# Patient Record
Sex: Female | Born: 1970 | Race: Black or African American | Hispanic: No | Marital: Married | Smoking: Never smoker
Health system: Southern US, Community
[De-identification: ages and names within clinical notes are randomized; demographics above are authoritative.]

## PROBLEM LIST (undated history)

## (undated) DIAGNOSIS — J45909 Unspecified asthma, uncomplicated: Secondary | ICD-10-CM

## (undated) DIAGNOSIS — E119 Type 2 diabetes mellitus without complications: Secondary | ICD-10-CM

---

## 1999-12-04 ENCOUNTER — Emergency Department (HOSPITAL_COMMUNITY): Admission: EM | Admit: 1999-12-04 | Discharge: 1999-12-04 | Payer: Self-pay | Admitting: Emergency Medicine

## 2000-04-02 ENCOUNTER — Emergency Department (HOSPITAL_COMMUNITY): Admission: EM | Admit: 2000-04-02 | Discharge: 2000-04-02 | Payer: Self-pay | Admitting: Emergency Medicine

## 2000-04-02 ENCOUNTER — Encounter: Payer: Self-pay | Admitting: Emergency Medicine

## 2000-05-04 ENCOUNTER — Emergency Department (HOSPITAL_COMMUNITY): Admission: EM | Admit: 2000-05-04 | Discharge: 2000-05-04 | Payer: Self-pay | Admitting: Emergency Medicine

## 2000-05-04 ENCOUNTER — Encounter: Payer: Self-pay | Admitting: Emergency Medicine

## 2000-07-29 ENCOUNTER — Emergency Department (HOSPITAL_COMMUNITY): Admission: EM | Admit: 2000-07-29 | Discharge: 2000-07-29 | Payer: Self-pay | Admitting: Emergency Medicine

## 2000-10-16 ENCOUNTER — Emergency Department (HOSPITAL_COMMUNITY): Admission: EM | Admit: 2000-10-16 | Discharge: 2000-10-17 | Payer: Self-pay | Admitting: Emergency Medicine

## 2000-10-24 ENCOUNTER — Emergency Department (HOSPITAL_COMMUNITY): Admission: EM | Admit: 2000-10-24 | Discharge: 2000-10-24 | Payer: Self-pay | Admitting: Emergency Medicine

## 2000-10-24 ENCOUNTER — Encounter: Payer: Self-pay | Admitting: Emergency Medicine

## 2001-02-12 ENCOUNTER — Emergency Department (HOSPITAL_COMMUNITY): Admission: EM | Admit: 2001-02-12 | Discharge: 2001-02-13 | Payer: Self-pay | Admitting: Emergency Medicine

## 2001-02-27 ENCOUNTER — Encounter: Payer: Self-pay | Admitting: Emergency Medicine

## 2001-02-27 ENCOUNTER — Emergency Department (HOSPITAL_COMMUNITY): Admission: EM | Admit: 2001-02-27 | Discharge: 2001-02-27 | Payer: Self-pay | Admitting: Emergency Medicine

## 2001-06-15 ENCOUNTER — Emergency Department (HOSPITAL_COMMUNITY): Admission: EM | Admit: 2001-06-15 | Discharge: 2001-06-15 | Payer: Self-pay | Admitting: Emergency Medicine

## 2001-06-15 ENCOUNTER — Encounter: Payer: Self-pay | Admitting: Emergency Medicine

## 2001-10-19 ENCOUNTER — Emergency Department (HOSPITAL_COMMUNITY): Admission: EM | Admit: 2001-10-19 | Discharge: 2001-10-19 | Payer: Self-pay | Admitting: Emergency Medicine

## 2002-01-11 ENCOUNTER — Emergency Department (HOSPITAL_COMMUNITY): Admission: EM | Admit: 2002-01-11 | Discharge: 2002-01-11 | Payer: Self-pay | Admitting: Emergency Medicine

## 2002-01-11 ENCOUNTER — Encounter: Payer: Self-pay | Admitting: Emergency Medicine

## 2002-11-02 ENCOUNTER — Encounter: Payer: Self-pay | Admitting: Emergency Medicine

## 2002-11-02 ENCOUNTER — Emergency Department (HOSPITAL_COMMUNITY): Admission: EM | Admit: 2002-11-02 | Discharge: 2002-11-02 | Payer: Self-pay | Admitting: Emergency Medicine

## 2003-03-17 ENCOUNTER — Emergency Department (HOSPITAL_COMMUNITY): Admission: EM | Admit: 2003-03-17 | Discharge: 2003-03-17 | Payer: Self-pay | Admitting: Emergency Medicine

## 2003-03-17 ENCOUNTER — Encounter: Payer: Self-pay | Admitting: Emergency Medicine

## 2003-05-04 ENCOUNTER — Emergency Department (HOSPITAL_COMMUNITY): Admission: EM | Admit: 2003-05-04 | Discharge: 2003-05-04 | Payer: Self-pay | Admitting: Emergency Medicine

## 2003-05-04 ENCOUNTER — Encounter: Payer: Self-pay | Admitting: Emergency Medicine

## 2003-05-17 ENCOUNTER — Emergency Department (HOSPITAL_COMMUNITY): Admission: EM | Admit: 2003-05-17 | Discharge: 2003-05-17 | Payer: Self-pay | Admitting: Emergency Medicine

## 2003-10-07 ENCOUNTER — Emergency Department (HOSPITAL_COMMUNITY): Admission: EM | Admit: 2003-10-07 | Discharge: 2003-10-07 | Payer: Self-pay | Admitting: Emergency Medicine

## 2004-04-03 ENCOUNTER — Emergency Department (HOSPITAL_COMMUNITY): Admission: EM | Admit: 2004-04-03 | Discharge: 2004-04-03 | Payer: Self-pay | Admitting: Emergency Medicine

## 2004-04-07 ENCOUNTER — Inpatient Hospital Stay (HOSPITAL_COMMUNITY): Admission: AD | Admit: 2004-04-07 | Discharge: 2004-04-07 | Payer: Self-pay | Admitting: Obstetrics and Gynecology

## 2004-12-05 ENCOUNTER — Emergency Department (HOSPITAL_COMMUNITY): Admission: EM | Admit: 2004-12-05 | Discharge: 2004-12-05 | Payer: Self-pay | Admitting: Emergency Medicine

## 2005-03-20 ENCOUNTER — Encounter: Payer: Self-pay | Admitting: Emergency Medicine

## 2005-03-20 ENCOUNTER — Ambulatory Visit (HOSPITAL_COMMUNITY): Admission: AD | Admit: 2005-03-20 | Discharge: 2005-03-20 | Payer: Self-pay | Admitting: Obstetrics and Gynecology

## 2005-03-20 ENCOUNTER — Encounter (INDEPENDENT_AMBULATORY_CARE_PROVIDER_SITE_OTHER): Payer: Self-pay | Admitting: Specialist

## 2005-10-21 ENCOUNTER — Emergency Department (HOSPITAL_COMMUNITY): Admission: EM | Admit: 2005-10-21 | Discharge: 2005-10-21 | Payer: Self-pay | Admitting: Emergency Medicine

## 2005-12-18 ENCOUNTER — Inpatient Hospital Stay (HOSPITAL_COMMUNITY): Admission: AD | Admit: 2005-12-18 | Discharge: 2005-12-18 | Payer: Self-pay | Admitting: Obstetrics and Gynecology

## 2006-09-26 ENCOUNTER — Emergency Department (HOSPITAL_COMMUNITY): Admission: EM | Admit: 2006-09-26 | Discharge: 2006-09-26 | Payer: Self-pay | Admitting: Emergency Medicine

## 2006-09-26 ENCOUNTER — Emergency Department (HOSPITAL_COMMUNITY): Admission: EM | Admit: 2006-09-26 | Discharge: 2006-09-26 | Payer: Self-pay | Admitting: Family Medicine

## 2008-03-29 ENCOUNTER — Emergency Department (HOSPITAL_COMMUNITY): Admission: EM | Admit: 2008-03-29 | Discharge: 2008-03-29 | Payer: Self-pay | Admitting: Family Medicine

## 2008-08-15 ENCOUNTER — Emergency Department (HOSPITAL_COMMUNITY): Admission: EM | Admit: 2008-08-15 | Discharge: 2008-08-15 | Payer: Self-pay | Admitting: Family Medicine

## 2008-08-15 ENCOUNTER — Ambulatory Visit: Payer: Self-pay | Admitting: *Deleted

## 2008-08-16 ENCOUNTER — Inpatient Hospital Stay (HOSPITAL_COMMUNITY): Admission: RE | Admit: 2008-08-16 | Discharge: 2008-08-17 | Payer: Self-pay | Admitting: *Deleted

## 2008-08-20 ENCOUNTER — Other Ambulatory Visit (HOSPITAL_COMMUNITY): Admission: RE | Admit: 2008-08-20 | Discharge: 2008-08-29 | Payer: Self-pay | Admitting: Psychiatry

## 2008-09-08 ENCOUNTER — Emergency Department (HOSPITAL_COMMUNITY): Admission: EM | Admit: 2008-09-08 | Discharge: 2008-09-09 | Payer: Self-pay | Admitting: Emergency Medicine

## 2009-07-10 ENCOUNTER — Emergency Department (HOSPITAL_COMMUNITY): Admission: EM | Admit: 2009-07-10 | Discharge: 2009-07-10 | Payer: Self-pay | Admitting: Family Medicine

## 2010-04-17 ENCOUNTER — Emergency Department (HOSPITAL_COMMUNITY): Admission: EM | Admit: 2010-04-17 | Discharge: 2010-04-17 | Payer: Self-pay | Admitting: Family Medicine

## 2010-04-24 ENCOUNTER — Emergency Department (HOSPITAL_COMMUNITY): Admission: EM | Admit: 2010-04-24 | Discharge: 2010-04-24 | Payer: Self-pay | Admitting: Family Medicine

## 2010-11-28 LAB — CULTURE, ROUTINE-ABSCESS

## 2010-12-11 ENCOUNTER — Inpatient Hospital Stay (INDEPENDENT_AMBULATORY_CARE_PROVIDER_SITE_OTHER)
Admission: RE | Admit: 2010-12-11 | Discharge: 2010-12-11 | Disposition: A | Discharge status: Home / Self Care | Payer: PRIVATE HEALTH INSURANCE | Source: Ambulatory Visit | Attending: Family Medicine | Admitting: Family Medicine

## 2010-12-11 DIAGNOSIS — M545 Low back pain: Secondary | ICD-10-CM

## 2010-12-11 DIAGNOSIS — F430 Acute stress reaction: Secondary | ICD-10-CM

## 2010-12-11 LAB — POCT URINALYSIS DIP (DEVICE)
Glucose, UA: NEGATIVE mg/dL
Hgb urine dipstick: NEGATIVE
Nitrite: NEGATIVE
Urobilinogen, UA: 0.2 mg/dL (ref 0.0–1.0)

## 2011-01-27 NOTE — H&P (Signed)
[name], Hines NO.:  [id number]   MEDICAL RECORD NO.:  [id number]          PATIENT TYPE:  IPS   LOCATION:  0302                          FACILITY:  [facility]   PHYSICIAN:  Jasmine Pang, M.D. DATE OF BIRTH:  23-Nov-1970   DATE OF ADMISSION:  08/16/2008  DATE OF DISCHARGE:                       PSYCHIATRIC ADMISSION ASSESSMENT   HISTORY OF PRESENT ILLNESS:  The patient had an intentional overdose on  muscle relaxants, taking 2 extra muscle relaxants.  She has been more  depressed since her husband had left.  States that the husband had left  [place] in May 2009.  Did not hear from him for 2 months later, and was  telling her at that time that the marriage was over.  She is trying to  cope with this.  She just feels very tired.  Has not been sleeping.  She  states her daughter had seen her take the pills.  She got very concerned  that her daughter had seen this.  The patient had then gone [facility] to get some help.  She also is endorsing auditory hallucinations,  with voices laughing at her, telling her that her husband is having an  affair.   PAST PSYCHIATRIC HISTORY:  First admission to [facility].  No current or past psychiatric treatment.   SOCIAL HISTORY:  This is a 40 year old female, currently separated.  Has  a 65 year old daughter who is currently staying with her friend while  the patient is hospitalized.  She does have supportive friends.   FAMILY HISTORY:  Mother with depression.   ALCOHOL AND DRUG HISTORY:  No alcohol or drug use.   PRIMARY CARE [name]:  Urgent Care.   MEDICAL PROBLEMS:  Denies any acute or chronic health issues.   MEDICATIONS:  Was on Lexapro in the past.  Felt it helpful, but did not  have any refills on her medications, and feels some of her depression is  due to this.   DRUG ALLERGIES:  No known allergies.   PHYSICAL EXAMINATION:  GENERAL:  This is a well-nourished, well-  developed female.  She  was fully assessed at [facility] ED.  Her  physical exam was reviewed, with no significant findings.  She did  receive 1 mg of Ativan.  VITAL SIGNS:  Temperature 96.8, 100 heart rate, 18 respirations, blood  pressure is 138/64, 100 percent saturated.   LABORATORY DATA:  Shows a BMET that is within normal limits.  CMET  within normal limits.  Urine drug screen is negative.  Alcohol level  less than 5.   MENTAL STATUS EXAMINATION:  This is a fully alert, cooperative female,  good eye contact.  She is casually dressed.  Her speech is soft-spoken,  normal pace.  Mood:  Endorsing suicidal thoughts, but promises safety.  Her affect is sad, depressed.  Thought processes are coherent.  No  evidence of psychosis.  Cognitive function intact.  Memory is good.  She  has good historian.  Judgment and insight appear to be good, and she  appears sincere.   ASSESSMENT:  AXIS I:  Major depressive  disorder, single episode, without  psychotic features.  AXIS II:  Deferred.  AXIS III:  No known medical conditions.  AXIS IV:  Problems with primary support group and possible other  psychosocial problems related to separation.  AXIS V:  Current is 40.   PLAN:  Contract for safety.  We will stabilize her mood and thinking.  We will continue with the Lexapro.  Reinforce medication compliance.  Will also have Seroquel at bedtime, as the patient has not been  sleeping, and endorsing psychotic symptoms.  Her tentative length of  stay at this time is 2-3 days.      Landry Corporal, N.P.      Jasmine Pang, M.D.  Electronically Signed    JO/MEDQ  D:  08/17/2008  T:  08/17/2008  Job:  [id number]

## 2011-01-27 NOTE — Discharge Summary (Signed)
NAMESHAMIKIA, [name] NO.:  [id number]   MEDICAL RECORD NO.:  [id number]          PATIENT TYPE:  IPS   LOCATION:  0302                          FACILITY:  [facility]   PHYSICIAN:  Jasmine Pang, M.D. DATE OF BIRTH:  [birth year]   DATE OF ADMISSION:  08/16/2008  DATE OF DISCHARGE:  08/17/2008                               DISCHARGE SUMMARY   IDENTIFICATION:  This is a 40 year old female who was admitted on  August 16, 2008.   HISTORY OF PRESENT ILLNESS:  The patient had an intentional overdose on  muscle relaxants taking too extra muscle relaxants.  She has been more  depressed since her husband left.  She states that her husband had left  [place] in May 2009.  She did not hear for him; for 2 months later and  was telling her at the time that the marriage was over.  She was trying  to cope with this.  She is felt very tired.  She has not been sleeping.  She states her daughter had seen her taking the pills.  She got very  concerned that her daughter had seen this.  The patient had gone to  urgent care to get some help.  She is also endorsing auditory  hallucinations with voices laughing at her telling her that her husband  is having an affair.   PAST PSYCHIATRIC HISTORY:  This is the first admission to [facility].  There was no current or past psychiatric treatment.   FAMILY HISTORY:  Mother has a history of depression.   ALCOHOL AND DRUG HISTORY:  There was no alcohol or drug use.   MEDICAL PROBLEMS:  She denies any acute or chronic health issues.   MEDICATIONS:  She was on Lexapro in the past.  She felt it was helpful  but did not have any refills on her medication and did not continue it.  She feels her depression is due to stopping the medication.   DRUG ALLERGIES:  No known drug allergies.   PHYSICAL FINDINGS:  This is a well-nourished, well-developed female.  She was fully assessed at [facility] ED.  There were no acute physical  or medical  problems noted.  Female was within normal limits.   LABORATORY DATA:  Urine drug screen was negative.  Alcohol level less  than 5.   HOSPITAL COURSE:  Upon admission, the patient was continued on Ambien 10  mg p.o. q.h.s., p.r.n. insomnia, Lexapro 20 mg p.o. daily, and Seroquel  50 mg p.o. daily at 2100.  The patient tolerated these medications well  with no significant side effects.  In individual sessions with me, the  patient was friendly and cooperative.  She also participated  appropriately in unit therapeutic groups and activities.  She was  depressed over the demise of her marriage.  He went back to [place] and  had not come back until recently.  When he came back, he lived somewhere  else.  He called a while he was in [place] to break off the marriage.  She states she is unable to have children and  is distraught about this.  She began to have a significant depression, fatigue, and decreased  energy.  There was some voices or thoughts talking to her female voices  rather telling her they were seeing her husband.  She admitted to a  family history of depression.  The patient had a daughter that she  wanted to return home to.  She wanted to go to the intensive outpatient  program.  She was very anxious to go home to her daughter.  On  08/17/2008, the patient stated she felt better today and wanted to go  home.  Appetite was improving.  There was still some middle of the night  awakening.  Mood was less depressed, less anxious.  Affect consistent  with mood.  There was no suicidal or homicidal ideation.  No thoughts of  self-injurious behavior.  No auditory or visual hallucinations.  No  paranoia or delusions.  Thoughts were logical and goal-directed.  Thought content, no predominant theme.  Cognitive was grossly intact.  Insight good.  Judgment good.  Impulse control good.  It was felt the  patient was safe to discharge today.   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  Axis II:  None.  Axis III:  None.  Axis IV:  Problems with primary support group and possible other  psychosocial problems related to the separation - severe.  Axis V:  Global assessment of functioning was 50 upon discharge.  GAF  was 40 upon admission.  GAF highest past year was 65.   DISCHARGE PLAN:  There were no specific activity level or dietary  restrictions.   POSTHOSPITAL CARE PLANS:  The patient will go to the [facility]  Intensive Outpatient Program on December 7, at 8:45 a.m.   DISCHARGE MEDICATIONS:  1. Lexapro 20 mg daily.  2. Seroquel 50 mg at 9 p.m.      Jasmine Pang, M.D.  Electronically Signed     BHS/MEDQ  D:  09/07/2008  T:  09/08/2008  Job:  [id number]

## 2011-01-30 NOTE — Op Note (Signed)
[name], [name]              ACCOUNT NO.:  [id number]   MEDICAL RECORD NO.:  [id number]          PATIENT TYPE:  AMB   LOCATION:  MATC                          FACILITY:  [facility]   PHYSICIAN:  Hal Morales, M.D.DATE OF BIRTH:  16-Nov-1970   DATE OF PROCEDURE:  03/20/2005  DATE OF DISCHARGE:                                 OPERATIVE REPORT   PREOPERATIVE DIAGNOSIS:  Ruptured tubal ectopic pregnancy.   POSTOPERATIVE DIAGNOSES:  1.  Left tubal abortion with hemoperitoneum.  2.  Right corpus luteum cyst.  3.  Status post bilateral tubal ligation for sterilization.  4.  Desire for preservation of fallopian tubes.   PROCEDURE:  Operative laparoscopy, removal of left tubal ectopic pregnancy.   SURGEON:  Hal Morales, M.D.   ANESTHESIA:  General orotracheal.   ESTIMATED BLOOD LOSS:  Less than 200 mL which was all in clot.   SPECIMENS TO PATHOLOGY:  Products of conception and clot.   COMPLICATIONS:  None.   FINDINGS:  The uterus was upper limits of normal size with a posterior  fibroid measuring approximately 2-3 cm. The tubes were both status post  tubal sterilization procedures with distal and proximal remnants. The right  ovary contained a corpus luteum cyst. The left ovary had adherent to it  products of conception and clot. There was a total of approximately 200 mL  of blood and clot in the abdomen.   DESCRIPTION OF PROCEDURE:  The patient was taken to the operating room after  appropriate identification and placed on the operating table. After the  attainment of adequate general anesthesia, she was placed in the modified  lithotomy position. The abdomen, perineum and vagina were prepped with  multiple layers of Betadine and a Foley catheter inserted into the bladder  then connected to straight drainage. A single-tooth tenaculum was placed on  the anterior cervix. The abdomen was draped as a sterile field. Subumbilical  and suprapubic injections of 0.25% Marcaine  for a total of 10 mL was  undertaken. A subumbilical incision was made and the Veress cannula placed  through that incision into the peritoneal cavity. A pneumoperitoneum was  created with 3 liters of CO2. The Veress cannula was removed and the  laparoscopic trocar placed through that incision into the peritoneal cavity.  The laparoscope was placed through the trocar sleeve. Suprapubic incisions  were made to the right and left of midline and a 5 mm trocar placed in the  left suprapubic incision, and an 11 mm trocar placed in the right suprapubic  incision. The above-noted findings were made and documented. The left  ectopic was removed from the ovary with a combination of blunt and sharp  dissection and hydrodissection. The products of conception were then placed  in a retrieval bag through the right suprapubic port and removed through  that port. Copious irrigation was carried out and all clot attempted to be  removed from the ovary and from the distal fallopian tube. At that point  hemostasis was noted to be adequate and all instruments were removed from  the peritoneal cavity under direct visualization as  the CO2 was allowed to  escape. The subumbilical and right suprapubic incisions were closed with  fascial sutures of #0 Vicryl and then subcuticular sutures of 3-0 Vicryl.  The left suprapubic incision was closed with a subcuticular suture of 3-0  Vicryl. Steri-Strips were applied. The Foley catheter and single-tooth  tenaculum removed and the patient was awakened from general anesthesia and  taken to the recovery room in satisfactory condition having tolerated the  procedure well with sponge and instrument counts correct. Blood type is O+.   DISPOSITION:  The patient is discharged home from the post anesthesia care  unit with printed instructions for laparoscopy. She is to follow-up with Dr.  Pennie Rushing in two weeks at [facility],  [address].       VPH/MEDQ  D:  03/20/2005  T:  03/20/2005  Job:  [id number]

## 2011-06-12 LAB — POCT PREGNANCY, URINE: Preg Test, Ur: NEGATIVE

## 2011-06-18 LAB — POCT I-STAT, CHEM 8
BUN: 8 mg/dL (ref 6–23)
Calcium, Ion: 1.23 mmol/L (ref 1.12–1.32)
Chloride: 101 mEq/L (ref 96–112)
Creatinine, Ser: 0.9 mg/dL (ref 0.4–1.2)
Glucose, Bld: 83 mg/dL (ref 70–99)
HCT: 42 % (ref 36.0–46.0)
Hemoglobin: 14.3 g/dL (ref 12.0–15.0)
Potassium: 4.2 mEq/L (ref 3.5–5.1)
Sodium: 140 mEq/L (ref 135–145)
TCO2: 30 mmol/L (ref 0–100)

## 2011-06-18 LAB — CBC
HCT: 41.4 % (ref 36.0–46.0)
Hemoglobin: 13.6 g/dL (ref 12.0–15.0)
MCHC: 32.9 g/dL (ref 30.0–36.0)
MCV: 88.5 fL (ref 78.0–100.0)
RBC: 4.67 MIL/uL (ref 3.87–5.11)

## 2011-06-18 LAB — DIFFERENTIAL
Basophils Relative: 3 % — ABNORMAL HIGH (ref 0–1)
Eosinophils Absolute: 0.3 10*3/uL (ref 0.0–0.7)
Eosinophils Relative: 5 % (ref 0–5)
Monocytes Absolute: 0.4 10*3/uL (ref 0.1–1.0)
Monocytes Relative: 7 % (ref 3–12)
Neutrophils Relative %: 58 % (ref 43–77)

## 2011-06-18 LAB — ETHANOL: Alcohol, Ethyl (B): <5 mg/dL (ref 0–10)

## 2011-06-18 LAB — RAPID URINE DRUG SCREEN, HOSP PERFORMED
Amphetamines: NOT DETECTED
Barbiturates: NOT DETECTED
Benzodiazepines: NOT DETECTED
Cocaine: NOT DETECTED
Opiates: NOT DETECTED
Tetrahydrocannabinol: NOT DETECTED

## 2011-06-18 LAB — TRICYCLICS SCREEN, URINE: TCA Scrn: NOT DETECTED

## 2011-06-19 LAB — URINALYSIS, ROUTINE W REFLEX MICROSCOPIC
Glucose, UA: NEGATIVE mg/dL
Hgb urine dipstick: NEGATIVE
Leukocytes, UA: NEGATIVE
Specific Gravity, Urine: 1.006 (ref 1.005–1.030)
Urobilinogen, UA: 0.2 mg/dL (ref 0.0–1.0)

## 2011-06-19 LAB — DIFFERENTIAL
Basophils Relative: 0 % (ref 0–1)
Lymphs Abs: 1.8 10*3/uL (ref 0.7–4.0)
Monocytes Relative: 7 % (ref 3–12)
Neutro Abs: 4 10*3/uL (ref 1.7–7.7)
Neutrophils Relative %: 62 % (ref 43–77)

## 2011-06-19 LAB — URINE MICROSCOPIC-ADD ON

## 2011-06-19 LAB — RAPID URINE DRUG SCREEN, HOSP PERFORMED
Amphetamines: NOT DETECTED
Opiates: NOT DETECTED
Tetrahydrocannabinol: NOT DETECTED

## 2011-06-19 LAB — CBC
Platelets: 354 10*3/uL (ref 150–400)
RBC: 4.78 MIL/uL (ref 3.87–5.11)
WBC: 6.5 10*3/uL (ref 4.0–10.5)

## 2011-06-19 LAB — POCT I-STAT, CHEM 8
BUN: 8 mg/dL (ref 6–23)
Calcium, Ion: 1.14 mmol/L (ref 1.12–1.32)
Chloride: 103 mEq/L (ref 96–112)
HCT: 46 % (ref 36.0–46.0)
Potassium: 3.6 mEq/L (ref 3.5–5.1)
Sodium: 139 mEq/L (ref 135–145)

## 2011-06-19 LAB — POCT PREGNANCY, URINE: Preg Test, Ur: NEGATIVE

## 2012-10-07 ENCOUNTER — Emergency Department (HOSPITAL_COMMUNITY)
Admission: EM | Admit: 2012-10-07 | Discharge: 2012-10-07 | Disposition: A | Discharge status: Home / Self Care | Payer: Managed Care, Other (non HMO) | Source: Home / Self Care | Attending: Family Medicine | Admitting: Family Medicine

## 2012-10-07 ENCOUNTER — Encounter (HOSPITAL_COMMUNITY): Payer: Self-pay | Admitting: Emergency Medicine

## 2012-10-07 DIAGNOSIS — M7702 Medial epicondylitis, left elbow: Secondary | ICD-10-CM

## 2012-10-07 DIAGNOSIS — M7700 Medial epicondylitis, unspecified elbow: Secondary | ICD-10-CM

## 2012-10-07 HISTORY — DX: Unspecified asthma, uncomplicated: J45.909

## 2012-10-07 MED ORDER — IBUPROFEN 600 MG PO TABS: 600.0000 mg | ORAL_TABLET | Freq: Three times a day (TID) | ORAL | Status: DC

## 2012-10-07 MED ORDER — TRAMADOL HCL 50 MG PO TABS: 50.0000 mg | ORAL_TABLET | Freq: Four times a day (QID) | ORAL | Status: DC | PRN

## 2012-10-07 NOTE — ED Notes (Signed)
Pt c/o left elbow pain x2 weeks Denies: inj/trauma Works at [employer] co and at [employer]  Pain increases w/activity Taking aleve for the pain  She is alert w/no signs of acute distress.

## 2012-10-07 NOTE — ED Provider Notes (Signed)
History     CSN: [id number]  Arrival date & time 10/07/12  1407   First MD Initiated Contact with Patient 10/07/12 1512      Chief Complaint  Patient presents with  . Arm Pain    (Consider location/radiation/quality/duration/timing/severity/associated sxs/prior treatment) HPI Comments: 42 year old female here complaining of pain in her left elbow for 2 weeks. Pain is constant and worse with movement. No known injury. No recent falls. 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 Has taken an over-the-counter medication with some improvement. Her blood pressure was found to be elevated today. Denies prior history of hypertension. Denies visual changes, headache, chest pain, shortness of breath, leg swelling, PND, difficulty with balance or gait. No extremity numbness, weakness or paresthesia.    Past Medical History  Diagnosis Date  . Asthma     History reviewed. No pertinent past surgical history.  No family history on file.  History  Substance Use Topics  . Smoking status: Never Smoker   . Smokeless tobacco: Not on file  . Alcohol Use: No    OB History    Grav Para Term Preterm Abortions TAB SAB Ect Mult Living                  Review of Systems  Constitutional: Negative for fever and chills.  Eyes: Negative for visual disturbance.  Respiratory: Negative for shortness of breath.   Cardiovascular: Negative for chest pain, palpitations and leg swelling.  Gastrointestinal: Negative for abdominal pain.  Musculoskeletal:       Asper HPI  Skin: Negative for rash.  Neurological: Negative for dizziness and headaches.    Allergies  Review of patient's allergies indicates no known allergies.  Home Medications   Current Outpatient Rx  Name  Route  Sig  Dispense  Refill  . IBUPROFEN 600 MG PO TABS   Oral   Take 1 tablet (600 mg total) by mouth 3 (three) times daily.   30 tablet    0   . TRAMADOL HCL 50 MG PO TABS   Oral   Take 1 tablet (50 mg total) by mouth every 6 (six) hours as needed for pain.   15 tablet   0     BP 174/97  Pulse 91  Temp 98.3 F (36.8 C) (Oral)  Resp 20  SpO2 100%  LMP 09/07/2012  Physical Exam  Nursing note and vitals reviewed. Constitutional: She is oriented to person, place, and time. She appears well-developed and well-nourished. No distress.  HENT:  Head: Normocephalic and atraumatic.  Eyes: Conjunctivae normal and EOM are normal. Pupils are equal, round, and reactive to light.  Neck: Neck supple. No JVD present. No thyromegaly present.  Cardiovascular: Normal rate, regular rhythm and normal heart sounds.  Exam reveals no gallop and no friction rub.   No murmur heard. Pulmonary/Chest: Effort normal and breath sounds normal.  Musculoskeletal:       Left elbow: no deformity, swelling, erythema or increased temperature. There is tenderness to palpation over medial epicondyle, pain worse with pronation of the fore arm and during elbow flexion and extension.  Left arm and fore arm muscle strength appears conserved as well as superficial sensation. Intact pulses.   Lymphadenopathy:    She has no cervical adenopathy.  Neurological: She is alert and oriented to person, place, and time.  Skin: No rash noted. She is not diaphoretic.    ED Course  Procedures (including critical care time)  Labs Reviewed - No data to display No results found.   1. Medial epicondylitis of left elbow       MDM  Medial epicondylitis treated with ibuprofen and tramadol. Supportive care including rehabilitation exercises discussed with patient and provided in writing. Orthopedic referral as needed. I checked patient's blood pressure manually was 165/95. Asymptomatic. Recommended to keep a blood pressure log and followup with her primary care provider to monitor her blood pressure.       Sharin Grave, MD 10/10/12 0126

## 2013-02-14 ENCOUNTER — Emergency Department (INDEPENDENT_AMBULATORY_CARE_PROVIDER_SITE_OTHER)
Admission: EM | Admit: 2013-02-14 | Discharge: 2013-02-14 | Disposition: A | Discharge status: Home / Self Care | Payer: Managed Care, Other (non HMO) | Source: Home / Self Care | Attending: Family Medicine | Admitting: Family Medicine

## 2013-02-14 ENCOUNTER — Encounter (HOSPITAL_COMMUNITY): Payer: Self-pay | Admitting: Emergency Medicine

## 2013-02-14 ENCOUNTER — Emergency Department (INDEPENDENT_AMBULATORY_CARE_PROVIDER_SITE_OTHER): Payer: Managed Care, Other (non HMO)

## 2013-02-14 DIAGNOSIS — M7700 Medial epicondylitis, unspecified elbow: Secondary | ICD-10-CM

## 2013-02-14 DIAGNOSIS — M533 Sacrococcygeal disorders, not elsewhere classified: Secondary | ICD-10-CM

## 2013-02-14 LAB — POCT URINALYSIS DIP (DEVICE)
Ketones, ur: NEGATIVE mg/dL
Leukocytes, UA: NEGATIVE
Protein, ur: NEGATIVE mg/dL
Specific Gravity, Urine: 1.015 (ref 1.005–1.030)
Urobilinogen, UA: 0.2 mg/dL (ref 0.0–1.0)
pH: 7.5 (ref 5.0–8.0)

## 2013-02-14 MED ORDER — DICLOFENAC POTASSIUM 50 MG PO TABS: 50.0000 mg | ORAL_TABLET | Freq: Three times a day (TID) | ORAL | Status: DC

## 2013-02-14 NOTE — ED Notes (Signed)
Pelvic pain for approx 2 weeks

## 2013-02-14 NOTE — ED Provider Notes (Signed)
History     CSN: [id number]  Arrival date & time 02/14/13  1143   First MD Initiated Contact with Patient 02/14/13 1209      Chief Complaint  Patient presents with  . Pelvic Pain    (Consider location/radiation/quality/duration/timing/severity/associated sxs/prior treatment) Patient is a 42 y.o. female presenting with back pain. The history is provided by the patient.  Back Pain Location:  Sacro-iliac joint Quality:  Stabbing and burning Radiates to:  Does not radiate Pain severity:  Mild Onset quality:  Gradual Duration:  2 weeks Timing:  Intermittent Progression:  Worsening Chronicity:  New Context: not recent illness   Associated symptoms: pelvic pain   Associated symptoms: no abdominal pain, no bladder incontinence, no bowel incontinence and no dysuria     Past Medical History  Diagnosis Date  . Asthma     History reviewed. No pertinent past surgical history.  No family history on file.  History  Substance Use Topics  . Smoking status: Never Smoker   . Smokeless tobacco: Not on file  . Alcohol Use: No    OB History   Grav Para Term Preterm Abortions TAB SAB Ect Mult Living                  Review of Systems  Constitutional: Negative.   Gastrointestinal: Negative.  Negative for abdominal pain and bowel incontinence.  Genitourinary: Positive for pelvic pain. Negative for bladder incontinence, dysuria, frequency, hematuria and vaginal bleeding.  Musculoskeletal: Positive for back pain.    Allergies  Review of patient's allergies indicates no known allergies.  Home Medications   Current Outpatient Rx  Name  Route  Sig  Dispense  Refill  . diclofenac (CATAFLAM) 50 MG tablet   Oral   Take 1 tablet (50 mg total) by mouth 3 (three) times daily. Prn pain   30 tablet   0   . ibuprofen (ADVIL,MOTRIN) 600 MG tablet   Oral   Take 1 tablet (600 mg total) by mouth 3 (three) times daily.   30 tablet   0   . traMADol (ULTRAM) 50 MG tablet   Oral  Take 1 tablet (50 mg total) by mouth every 6 (six) hours as needed for pain.   15 tablet   0     BP 154/84  Pulse 80  Temp(Src) 97.6 F (36.4 C) (Oral)  Resp 16  SpO2 100%  LMP 01/31/2013  Physical Exam  Nursing note and vitals reviewed. Constitutional: She is oriented to person, place, and time. She appears well-developed and well-nourished.  Abdominal: Soft. Bowel sounds are normal. She exhibits no mass. There is no tenderness.  Musculoskeletal: She exhibits tenderness.       Lumbar back: She exhibits bony tenderness. She exhibits normal range of motion, no tenderness, no swelling, no edema, no spasm and normal pulse.       Back:  Neurological: She is alert and oriented to person, place, and time.  Skin: Skin is warm and dry.    ED Course  Procedures (including critical care time)  Labs Reviewed  POCT URINALYSIS DIP (DEVICE)   Dg Pelvis 1-2 Views  02/14/2013   *RADIOLOGY REPORT*  Clinical Data: Sacral pain.  PELVIS - 1-2 VIEW  Comparison: None.  Findings: There are degenerative changes and sclerosis at the sacroiliac joints and at the symphysis pubis consistent with previous childbirth.  The pelvic bones are otherwise normal including the sacrum.  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   Original Report Authenticated By: Francene Boyers, M.D.     1. Sacroiliac joint pain       MDM  X-rays reviewed and report per radiologist.         Linna Hoff, MD 02/14/13 1302

## 2013-02-14 NOTE — ED Notes (Signed)
Printed work note as instructed by dr kindl: reporting patient was seen today.  Patient requested a day off, spoke to dr kindl. No change in original work note statement.

## 2014-06-14 IMAGING — CR DG SHOULDER 2+V*L*
3 series · 3 of 3 positions shown · non-contrast
Comparison: None.

CLINICAL DATA: Left shoulder pain post lifting heavy load

EXAM:
LEFT SHOULDER - 2+ VIEW

[AP (1 of 2)]
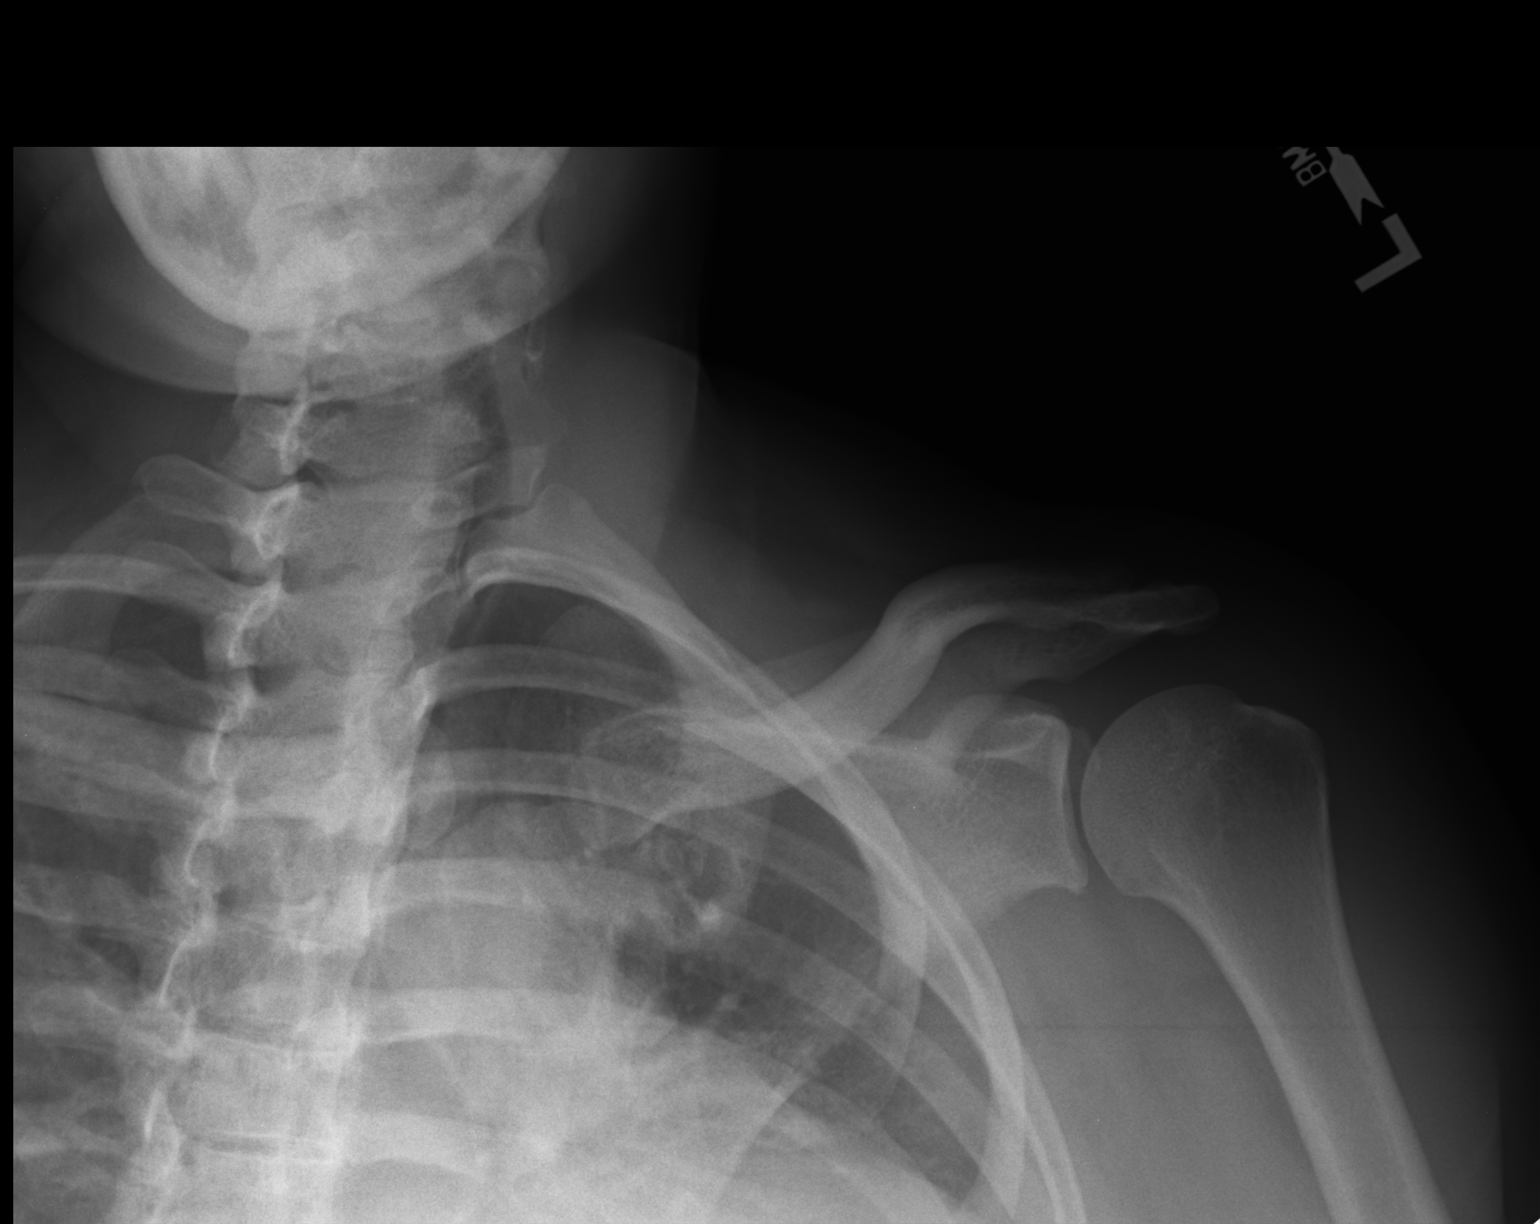

[AP (2 of 2)]
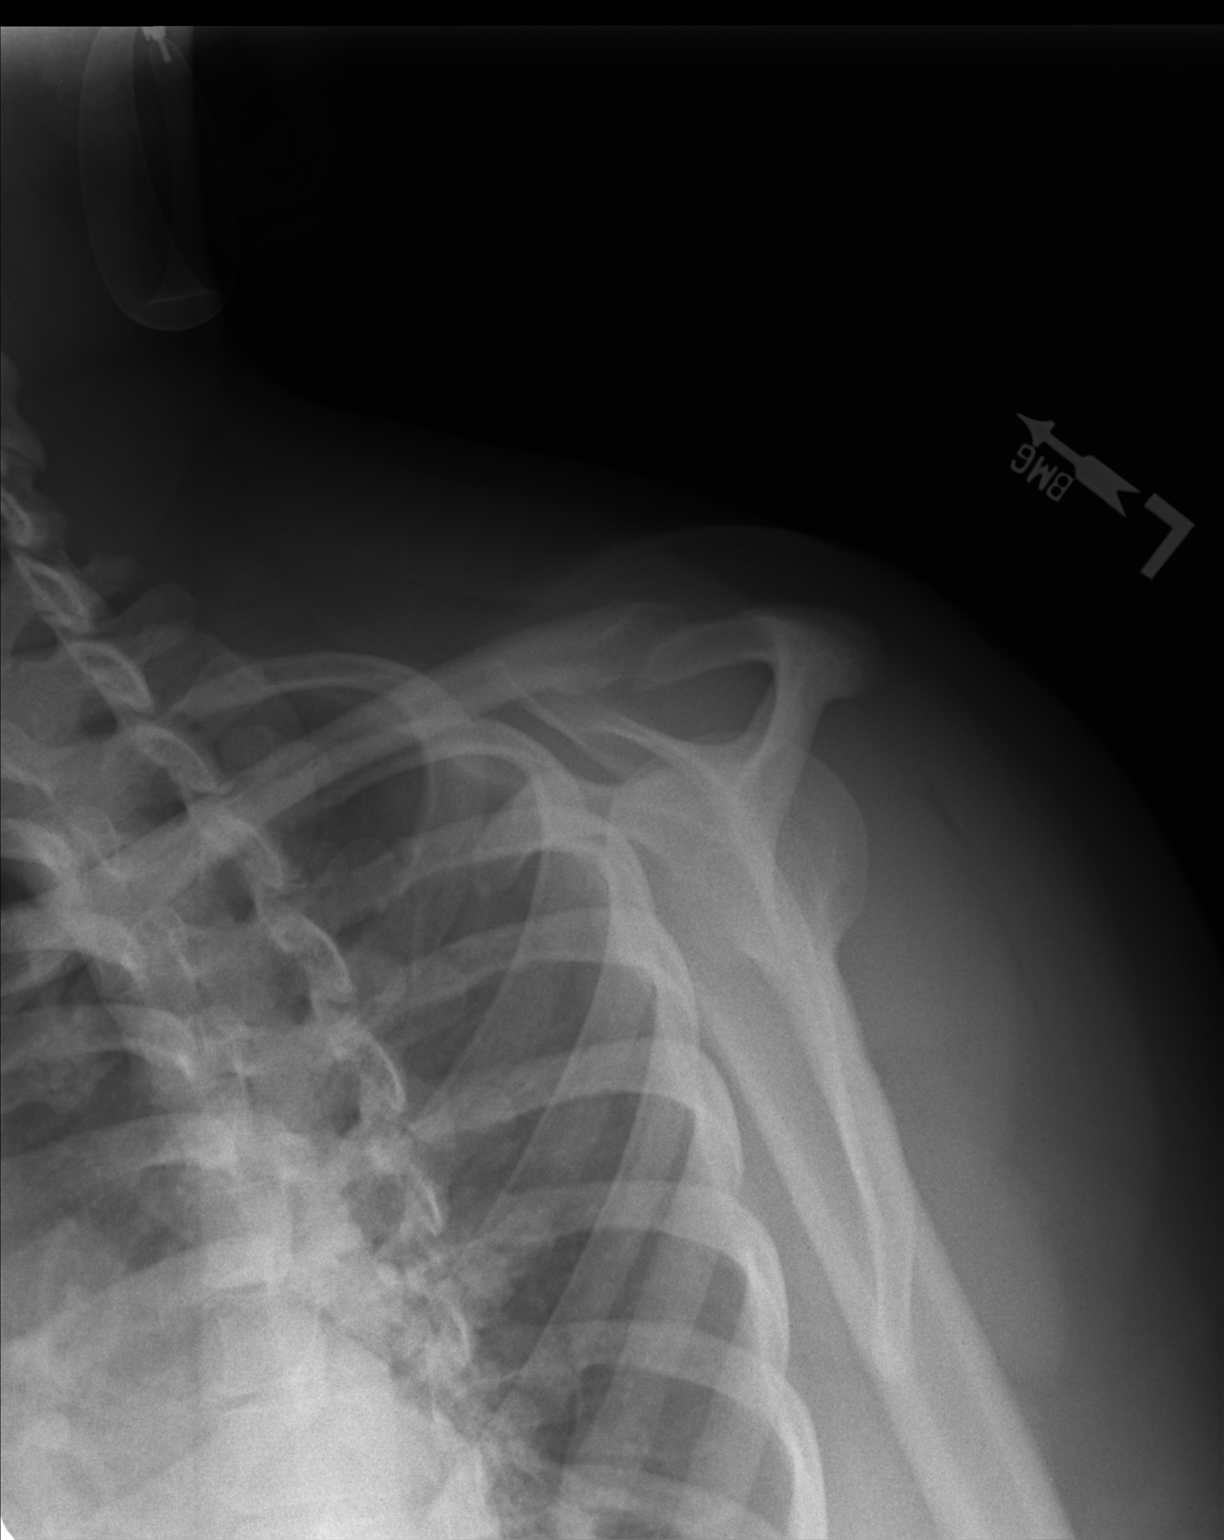

[axial]
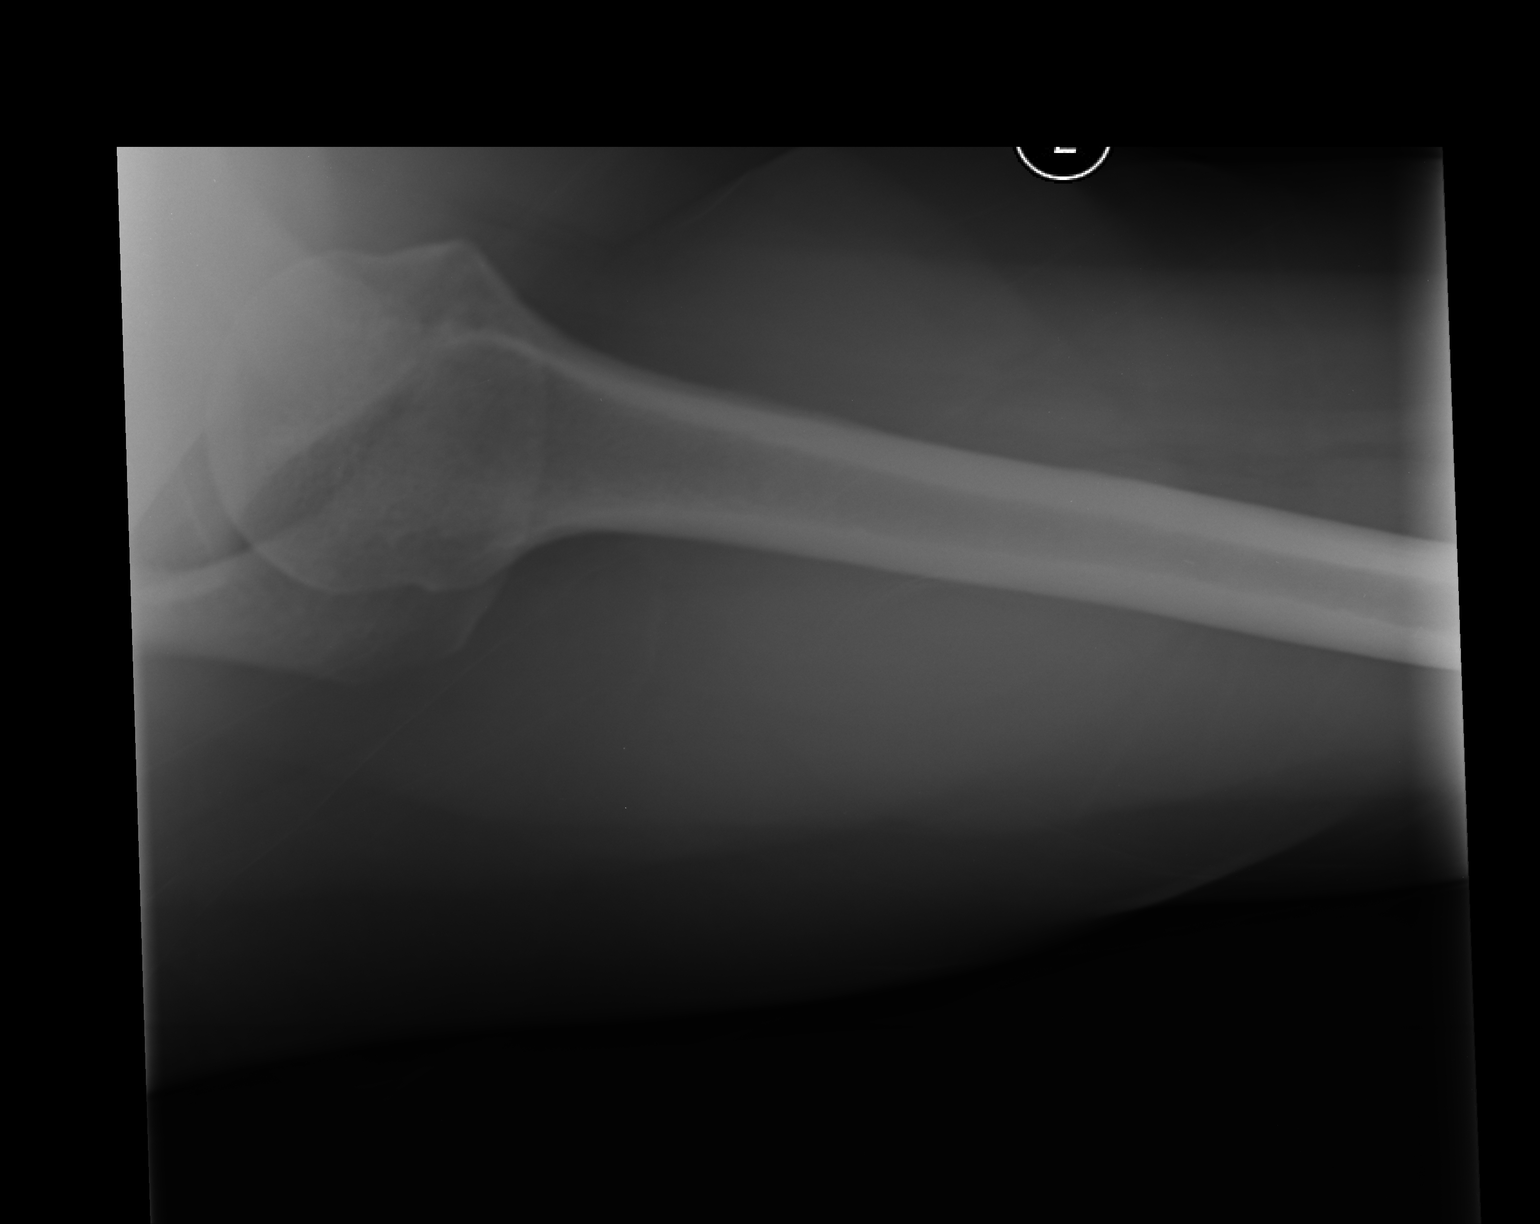

[3 of 3 positions shown; findings below may reference images not displayed]

FINDINGS: Three views of the left shoulder submitted. No acute fracture or
subluxation. No radiopaque foreign body. Glenohumeral joint is
preserved.
IMPRESSION: Negative.

## 2015-05-30 ENCOUNTER — Encounter (HOSPITAL_COMMUNITY): Payer: Self-pay | Admitting: Emergency Medicine

## 2015-05-30 ENCOUNTER — Emergency Department (INDEPENDENT_AMBULATORY_CARE_PROVIDER_SITE_OTHER)
Admission: EM | Admit: 2015-05-30 | Discharge: 2015-05-30 | Disposition: A | Discharge status: Home / Self Care | Payer: Self-pay | Source: Home / Self Care | Attending: Family Medicine | Admitting: Family Medicine

## 2015-05-30 DIAGNOSIS — M25562 Pain in left knee: Secondary | ICD-10-CM

## 2015-05-30 DIAGNOSIS — R0982 Postnasal drip: Secondary | ICD-10-CM

## 2015-05-30 DIAGNOSIS — J029 Acute pharyngitis, unspecified: Secondary | ICD-10-CM

## 2015-05-30 NOTE — ED Notes (Signed)
C/o left leg and lower back pain x 3 days, great toe numbness (left foot) Sore throat for 2 weeks, more itchy

## 2015-05-30 NOTE — Discharge Instructions (Signed)
Knee Pain Ibuprofen or aleve. Ice to knee The knee is the complex joint between your thigh and your lower leg. It is made up of bones, tendons, ligaments, and cartilage. The bones that make up the knee are:  The femur in the thigh.  The tibia and fibula in the lower leg.  The patella or kneecap riding in the groove on the lower femur. CAUSES  Knee pain is a common complaint with many causes. A few of these causes are:  Injury, such as:  A ruptured ligament or tendon injury.  Torn cartilage.  Medical conditions, such as:  Gout  Arthritis  Infections  Overuse, over training, or overdoing a physical activity. Knee pain can be minor or severe. Knee pain can accompany debilitating injury. Minor knee problems often respond well to self-care measures or get well on their own. More serious injuries may need medical intervention or even surgery. SYMPTOMS The knee is complex. Symptoms of knee problems can vary widely. Some of the problems are:  Pain with movement and weight bearing.  Swelling and tenderness.  Buckling of the knee.  Inability to straighten or extend your knee.  Your knee locks and you cannot straighten it.  Warmth and redness with pain and fever.  Deformity or dislocation of the kneecap. DIAGNOSIS  Determining what is wrong may be very straight forward such as when there is an injury. It can also be challenging because of the complexity of the knee. Tests to make a diagnosis may include:  Your caregiver taking a history and doing a physical exam.  Routine X-rays can be used to rule out other problems. X-rays will not reveal a cartilage tear. Some injuries of the knee can be diagnosed by:  Arthroscopy a surgical technique by which a small video camera is inserted through tiny incisions on the sides of the knee. This procedure is used to examine and repair internal knee joint problems. Tiny instruments can be used during arthroscopy to repair the torn knee  cartilage (meniscus).  Arthrography is a radiology technique. A contrast liquid is directly injected into the knee joint. Internal structures of the knee joint then become visible on X-ray film.  An MRI scan is a non X-ray radiology procedure in which magnetic fields and a computer produce two- or three-dimensional images of the inside of the knee. Cartilage tears are often visible using an MRI scanner. MRI scans have largely replaced arthrography in diagnosing cartilage tears of the knee.  Blood work.  Examination of the fluid that helps to lubricate the knee joint (synovial fluid). This is done by taking a sample out using a needle and a syringe. TREATMENT The treatment of knee problems depends on the cause. Some of these treatments are:  Depending on the injury, proper casting, splinting, surgery, or physical therapy care will be needed.  Give yourself adequate recovery time. Do not overuse your joints. If you begin to get sore during workout routines, back off. Slow down or do fewer repetitions.  For repetitive activities such as cycling or running, maintain your strength and nutrition.  Alternate muscle groups. For example, if you are a weight lifter, work the upper body on one day and the lower body the next.  Either tight or weak muscles do not give the proper support for your knee. Tight or weak muscles do not absorb the stress placed on the knee joint. Keep the muscles surrounding the knee strong.  Take care of mechanical problems.  If you have flat feet,  orthotics or special shoes may help. See your caregiver if you need help.  Arch supports, sometimes with wedges on the inner or outer aspect of the heel, can help. These can shift pressure away from the side of the knee most bothered by osteoarthritis.  A brace called an "unloader" brace also may be used to help ease the pressure on the most arthritic side of the knee.  If your caregiver has prescribed crutches, braces, wraps  or ice, use as directed. The acronym for this is PRICE. This means protection, rest, ice, compression, and elevation.  Nonsteroidal anti-inflammatory drugs (NSAIDs), can help relieve pain. But if taken immediately after an injury, they may actually increase swelling. Take NSAIDs with food in your stomach. Stop them if you develop stomach problems. Do not take these if you have a history of ulcers, stomach pain, or bleeding from the bowel. Do not take without your caregiver's approval if you have problems with fluid retention, heart failure, or kidney problems.  For ongoing knee problems, physical therapy may be helpful.  Glucosamine and chondroitin are over-the-counter dietary supplements. Both may help relieve the pain of osteoarthritis in the knee. These medicines are different from the usual anti-inflammatory drugs. Glucosamine may decrease the rate of cartilage destruction.  Injections of a corticosteroid drug into your knee joint may help reduce the symptoms of an arthritis flare-up. They may provide pain relief that lasts a few months. You may have to wait a few months between injections. The injections do have a small increased risk of infection, water retention, and elevated blood sugar levels.  Hyaluronic acid injected into damaged joints may ease pain and provide lubrication. These injections may work by reducing inflammation. A series of shots may give relief for as long as 6 months.  Topical painkillers. Applying certain ointments to your skin may help relieve the pain and stiffness of osteoarthritis. Ask your pharmacist for suggestions. Many over the-counter products are approved for temporary relief of arthritis pain.  In some countries, doctors often prescribe topical NSAIDs for relief of chronic conditions such as arthritis and tendinitis. A review of treatment with NSAID creams found that they worked as well as oral medications but without the serious side  effects. PREVENTION  Maintain a healthy weight. Extra pounds put more strain on your joints.  Get strong, stay limber. Weak muscles are a common cause of knee injuries. Stretching is important. Include flexibility exercises in your workouts.  Be smart about exercise. If you have osteoarthritis, chronic knee pain or recurring injuries, you may need to change the way you exercise. This does not mean you have to stop being active. If your knees ache after jogging or playing basketball, consider switching to swimming, water aerobics, or other low-impact activities, at least for a few days a week. Sometimes limiting high-impact activities will provide relief.  Make sure your shoes fit well. Choose footwear that is right for your sport.  Protect your knees. Use the proper gear for knee-sensitive activities. Use kneepads when playing volleyball or laying carpet. Buckle your seat belt every time you drive. Most shattered kneecaps occur in car accidents.  Rest when you are tired. SEEK MEDICAL CARE IF:  You have knee pain that is continual and does not seem to be getting better.  SEEK IMMEDIATE MEDICAL CARE IF:  Your knee joint feels hot to the touch and you have a high fever. MAKE SURE YOU:   Understand these instructions.  Will watch your condition.  Will get help  right away if you are not doing well or get worse. Document Released: 06/28/2007 Document Revised: 11/23/2011 Document Reviewed: 06/28/2007 [facility] Patient Information 2015 [place], [place]. This information is not intended to replace advice given to you by your health care provider. Make sure you discuss any questions you have with your health care provider. Sore Throat Take allegra  or Claritin 10 mg daily May also use flonase nasal spray as needed for allergies  A sore throat is pain, burning, irritation, or scratchiness of the throat. There is often pain or tenderness when swallowing or talking. A sore throat may be  accompanied by other symptoms, such as coughing, sneezing, fever, and swollen neck glands. A sore throat is often the first sign of another sickness, such as a cold, flu, strep throat, or mononucleosis (commonly known as mono). Most sore throats go away without medical treatment. CAUSES  The most common causes of a sore throat include:  A viral infection, such as a cold, flu, or mono.  A bacterial infection, such as strep throat, tonsillitis, or whooping cough.  Seasonal allergies.  Dryness in the air.  Irritants, such as smoke or pollution.  Gastroesophageal reflux disease (GERD). HOME CARE INSTRUCTIONS   Only take over-the-counter medicines as directed by your caregiver.  Drink enough fluids to keep your urine clear or pale yellow.  Rest as needed.  Try using throat sprays, lozenges, or sucking on hard candy to ease any pain (if older than 4 years or as directed).  Sip warm liquids, such as broth, herbal tea, or warm water with honey to relieve pain temporarily. You may also eat or drink cold or frozen liquids such as frozen ice pops.  Gargle with salt water (mix 1 tsp salt with 8 oz of water).  Do not smoke and avoid secondhand smoke.  Put a cool-mist humidifier in your bedroom at night to moisten the air. You can also turn on a hot shower and sit in the bathroom with the door closed for 5-10 minutes. SEEK IMMEDIATE MEDICAL CARE IF:  You have difficulty breathing.  You are unable to swallow fluids, soft foods, or your saliva.  You have increased swelling in the throat.  Your sore throat does not get better in 7 days.  You have nausea and vomiting.  You have a fever or persistent symptoms for more than 2-3 days.  You have a fever and your symptoms suddenly get worse. MAKE SURE YOU:   Understand these instructions.  Will watch your condition.  Will get help right away if you are not doing well or get worse. Document Released: 10/08/2004 Document Revised:  08/17/2012 Document Reviewed: 05/08/2012 [facility] Patient Information 2015 [place], [place]. This information is not intended to replace advice given to you by your health care provider. Make sure you discuss any questions you have with your health care provider.

## 2015-05-30 NOTE — ED Provider Notes (Signed)
CSN: [id number]     Arrival date & time 05/30/15  1454 History   First MD Initiated Contact with Patient 05/30/15 1707     Chief Complaint  Patient presents with  . Back Pain  . Sore Throat   (Consider location/radiation/quality/duration/timing/severity/associated sxs/prior Treatment) HPI Comments: 44 year old female complaining of a sore throat for 2 weeks. It is associated with PND and itching in the throat. Denies fever, chills or earache.  Second complaint is that of left leg pain and knee pain. Gradual onset started about 4 days ago. Denies any known injury or trauma. No fall. Pain is worse with bearing weight. She points to the entire knee in the states the pain feels as though it is deep inside the bone. Numbness L great toe. Has to wear steel toed shoes daily that exacerbates discomfort.   Past Medical History  Diagnosis Date  . Asthma    History reviewed. No pertinent past surgical history. No family history on file. Social History  Substance Use Topics  . Smoking status: Never Smoker   . Smokeless tobacco: None  . Alcohol Use: No   OB History    No data available     Review of Systems  Constitutional: Negative for fever, diaphoresis, activity change and fatigue.  HENT: Positive for postnasal drip and sore throat. Negative for ear pain.   Eyes: Negative for visual disturbance.  Respiratory: Negative for cough.   Cardiovascular: Negative for chest pain.  Gastrointestinal: Negative.   Genitourinary: Negative.   Musculoskeletal: Positive for arthralgias.  Skin: Negative for rash.  Neurological: Positive for numbness.       L great toe.  Hematological: Negative.   All other systems reviewed and are negative.   Allergies  Review of patient's allergies indicates no known allergies.  Home Medications   Prior to Admission medications   Medication Sig Start Date End Date Taking? Authorizing Provider  diclofenac (CATAFLAM) 50 MG tablet Take 1 tablet (50 mg total)  by mouth 3 (three) times daily. Prn pain 02/14/13   Linna Hoff, MD  ibuprofen (ADVIL,MOTRIN) 600 MG tablet Take 1 tablet (600 mg total) by mouth 3 (three) times daily. 10/07/12   Adlih Moreno-Coll, MD  traMADol (ULTRAM) 50 MG tablet Take 1 tablet (50 mg total) by mouth every 6 (six) hours as needed for pain. 10/07/12   Adlih Moreno-Coll, MD   Meds Ordered and Administered this Visit  Medications - No data to display  BP 167/96 mmHg  Pulse 84  Temp(Src) 97 F (36.1 C) (Oral)  Resp 18  SpO2 98%  LMP 05/17/2015 No data found.   Physical Exam  Constitutional: She is oriented to person, place, and time. She appears well-developed and well-nourished. No distress.  HENT:  Right Ear: External ear normal.  Left Ear: External ear normal.  Mouth/Throat: No oropharyngeal exudate.   oropharynx with mild erythema, PND and cobblestoning.   Eyes: Conjunctivae and EOM are normal.  Neck: Normal range of motion. Neck supple.  Cardiovascular: Normal rate, regular rhythm and normal heart sounds.   Pulmonary/Chest: Effort normal. No respiratory distress. She has no wheezes. She has no rales.  Musculoskeletal: She exhibits no edema.  Left knee without swelling, discoloration or deformity. Exhibits full range of motion flexion to 110. Extension to 180. Internal and external rotation does not produce pain. Palpation does not produce pain. There is no apparent effusion or swelling at the medial and lateral joint spaces. Negative drawer, negative varus, negative valgus,. No tenderness or swelling  to the quadriceps muscles, gastrocnemius or foot. Full range of motion of the ankle.  Lymphadenopathy:    She has no cervical adenopathy.  Neurological: She is alert and oriented to person, place, and time. She exhibits normal muscle tone.  Skin: Skin is warm and dry.  Psychiatric: She has a normal mood and affect.  Nursing note and vitals reviewed.   ED Course  Procedures (including critical care  time)  Labs Review Labs Reviewed - No data to display  Imaging Review No results found.   Visual Acuity Review  Right Eye Distance:   Left Eye Distance:   Bilateral Distance:    Right Eye Near:   Left Eye Near:    Bilateral Near:         MDM   1. PND (post-nasal drip)   2. Sore throat   3. Left knee pain    Sore Throat Take allegra  or Claritin 10 mg daily May also use flonase nasal spray as needed for allergies Aleve or ibuprofen for pain Ice as needed.  F/U with your  doctor   Hayden Rasmussen, NP 05/30/15 1755

## 2015-10-21 ENCOUNTER — Emergency Department (HOSPITAL_COMMUNITY)
Admission: EM | Admit: 2015-10-21 | Discharge: 2015-10-22 | Disposition: A | Discharge status: Home / Self Care | Payer: BLUE CROSS/BLUE SHIELD | Attending: Emergency Medicine | Admitting: Emergency Medicine

## 2015-10-21 ENCOUNTER — Encounter (HOSPITAL_COMMUNITY): Payer: Self-pay | Admitting: Emergency Medicine

## 2015-10-21 DIAGNOSIS — R739 Hyperglycemia, unspecified: Secondary | ICD-10-CM

## 2015-10-21 DIAGNOSIS — J45909 Unspecified asthma, uncomplicated: Secondary | ICD-10-CM | POA: Diagnosis not present

## 2015-10-21 DIAGNOSIS — E1165 Type 2 diabetes mellitus with hyperglycemia: Secondary | ICD-10-CM | POA: Insufficient documentation

## 2015-10-21 DIAGNOSIS — R079 Chest pain, unspecified: Secondary | ICD-10-CM

## 2015-10-21 DIAGNOSIS — Z79899 Other long term (current) drug therapy: Secondary | ICD-10-CM | POA: Diagnosis not present

## 2015-10-21 DIAGNOSIS — I10 Essential (primary) hypertension: Secondary | ICD-10-CM

## 2015-10-21 HISTORY — DX: Type 2 diabetes mellitus without complications: E11.9

## 2015-10-21 LAB — BASIC METABOLIC PANEL
Anion gap: 12 (ref 5–15)
BUN: 13 mg/dL (ref 6–20)
CALCIUM: 9.3 mg/dL (ref 8.9–10.3)
CHLORIDE: 97 mmol/L — AB (ref 101–111)
CO2: 26 mmol/L (ref 22–32)
CREATININE: 0.57 mg/dL (ref 0.44–1.00)
Glucose, Bld: 300 mg/dL — ABNORMAL HIGH (ref 65–99)
Potassium: 3.9 mmol/L (ref 3.5–5.1)
SODIUM: 135 mmol/L (ref 135–145)

## 2015-10-21 LAB — CBG MONITORING, ED
GLUCOSE-CAPILLARY: 258 mg/dL — AB (ref 65–99)
GLUCOSE-CAPILLARY: 283 mg/dL — AB (ref 65–99)

## 2015-10-21 LAB — CBC
HCT: 43.4 % (ref 36.0–46.0)
Hemoglobin: 14 g/dL (ref 12.0–15.0)
MCH: 27.8 pg (ref 26.0–34.0)
MCHC: 32.3 g/dL (ref 30.0–36.0)
MCV: 86.1 fL (ref 78.0–100.0)
PLATELETS: 374 10*3/uL (ref 150–400)
RBC: 5.04 MIL/uL (ref 3.87–5.11)
RDW: 13.2 % (ref 11.5–15.5)
WBC: 7.6 10*3/uL (ref 4.0–10.5)

## 2015-10-21 NOTE — ED Notes (Addendum)
Pt states that she was dx with diabetes last week and has been feeling bad but hasn't had her f/u yet which is Thursday. States her BG today is 426 with the RN at her PCP and she is hypertensive without a hx of HTN. Alert and oriented.

## 2015-10-21 NOTE — ED Notes (Signed)
CBG 258 in triage.

## 2015-10-21 NOTE — ED Notes (Signed)
PT UP TO REGISTRATION DESK ASKING STAFF IF SHE CAN EAT THIS WRITER INFORMED PT THAT SHE CANNOT EAT OR DRINK ANYTHING UNTIL SHE SEES THE DR PT [name]

## 2015-10-22 LAB — I-STAT TROPONIN, ED: TROPONIN I, POC: 0 ng/mL (ref 0.00–0.08)

## 2015-10-22 MED ORDER — IBUPROFEN 800 MG PO TABS
800.0000 mg | ORAL_TABLET | Freq: Once | ORAL | Status: AC
  Administered 2015-10-22: 800 mg via ORAL
  Filled 2015-10-22: qty 1

## 2015-10-22 MED ORDER — HYDROCHLOROTHIAZIDE 25 MG PO TABS: 25.0000 mg | ORAL_TABLET | Freq: Every day | ORAL | Status: AC

## 2015-10-22 MED ORDER — SODIUM CHLORIDE 0.9 % IV BOLUS (SEPSIS): 1000.0000 mL | Freq: Once | INTRAVENOUS | Status: DC

## 2015-10-22 MED ORDER — KETOROLAC TROMETHAMINE 30 MG/ML IJ SOLN: 30.0000 mg | Freq: Once | INTRAMUSCULAR | Status: DC

## 2015-10-22 MED ORDER — METFORMIN HCL 500 MG PO TABS: 500.0000 mg | ORAL_TABLET | Freq: Every day | ORAL | Status: AC

## 2015-10-22 NOTE — ED Provider Notes (Addendum)
By signing my name below, I, [name], attest that this documentation has been prepared under the direction and in the presence of [name] N Deanna Crull, DO . Electronically Signed: Marisue Hines, Scribe. 10/22/2015. 12:30 AM.  TIME SEEN: 12:25 AM  CHIEF COMPLAINT: Hyperglycemia  HPI: HPI Comments:  [name] is a 45 y.Hines. female with PMHx of DM and HTN who presents to the Emergency Department complaining of hyperglycemia earlier today. She reports feeling bad at work and measured a 426 blood sugar and 185 SBP. Pt notes she was diagnosed with DM last week by her PCP Dr. Parke Simmers. States that this was an incidental finding and she went to see her primary care physician for back pain. She was prescribed Prednisone and muscle relaxer at that time, which she finished over the weekend. States that she was told her blood glucose was elevated before she started prednisone. She also reports "heaviness" in her chest onset a few days ago. Last episode was yesterday. No shortness of breath. Does complain of a diffuse throbbing headache. Has had similar headaches before. She notes usually taking Ibuprofen for pain. No treatments attempted PTA. No alleviating or exacerbating factors noted. She denies medication for BP and current DM medication. She has an appointment to see Dr. Parke Simmers in 3 days for follow-up for her blood pressure and blood sugar. Pt denies vomiting, diarrhea, fever or SOB. No numbness, tingling or focal weakness. No chest pain currently.  ROS: See HPI Constitutional: no fever  Eyes: no drainage  ENT: no runny nose   Cardiovascular:  no chest pain  Resp: no SOB  GI: no vomiting GU: no dysuria Integumentary: no rash  Allergy: no hives  Musculoskeletal: no leg swelling  Neurological: no slurred speech ROS otherwise negative  PAST MEDICAL HISTORY/PAST SURGICAL HISTORY:  Past Medical History  Diagnosis Date  . Asthma   . Diabetes mellitus without complication (HCC)      MEDICATIONS:  Prior to Admission medications   Medication Sig Start Date End Date Taking? Authorizing Provider  Ascorbic Acid (VITAMIN C PO) Take by mouth daily.   Yes Historical Provider, MD  Cholecalciferol (VITAMIN D3 PO) Take by mouth daily.   Yes Historical Provider, MD  cyclobenzaprine (FLEXERIL) 10 MG tablet Take 10 mg by mouth 3 (three) times daily as needed. 10/09/15  Yes Historical Provider, MD    ALLERGIES:  No Known Allergies  SOCIAL HISTORY:  Social History  Substance Use Topics  . Smoking status: Never Smoker   . Smokeless tobacco: Not on file  . Alcohol Use: No    FAMILY HISTORY: No family history on file.  EXAM: BP 175/98 mmHg  Pulse 97  Temp(Src) 98.2 F (36.8 C) (Oral)  Resp 20  SpO2 99%  LMP 10/18/2015 CONSTITUTIONAL: Alert and oriented and responds appropriately to questions. Well-appearing; well-nourished HEAD: Normocephalic EYES: Conjunctivae clear, PERRL ENT: normal nose; no rhinorrhea; moist mucous membranes; pharynx without lesions noted NECK: Supple, no meningismus, no LAD  CARD: RRR; S1 and S2 appreciated; no murmurs, no clicks, no rubs, no gallops RESP: Normal chest excursion without splinting or tachypnea; breath sounds clear and equal bilaterally; no wheezes, no rhonchi, no rales, no hypoxia or respiratory distress, speaking full sentences ABD/GI: Normal bowel sounds; non-distended; soft, non-tender, no rebound, no guarding, no peritoneal signs BACK:  The back appears normal and is non-tender to palpation, there is no CVA tenderness EXT: Normal ROM in all joints; non-tender to palpation; no edema; normal capillary refill; no cyanosis, no calf tenderness  or swelling    SKIN: Normal color for age and race; warm NEURO: Moves all extremities equally, sensation to light touch intact diffusely, cranial nerves II through XII intact PSYCH: The patient's mood and manner are appropriate. Grooming and personal hygiene are appropriate.  MEDICAL  DECISION MAKING: Patient here with complaints of high blood pressure and hyperglycemia found at work today. Does report that she did have some chest heaviness yesterday but none currently. None at all today. We'll obtain EKG and troponin. No shortness of breath. Given she has a symptomatic do not feel that she needs a chest x-ray at this time. Labs show glucose of 283 but no signs of DKA. Will give ibuprofen for her headache. She is neurologically intact. Has had similar headaches in the past. Doubt infectious etiology or hemorrhage. I do not feel she needs imaging of her head. She has outpatient follow-up scheduled.  ED PROGRESS: 1:50 AM  Pt's EKG shows no new ischemic changes compared to EKG in 2006. Troponin is negative. Last episode of chest heaviness was over 24 hours ago. I feel she is safe to be discharged home with close outpatient follow-up which she has scheduled on the next 3 days with Dr. Parke Simmers for management of her hypertension and hyperglycemia. Again she is not in DKA today. She is complaining of mild diffuse headache but has had similar headaches in the past. Given ibuprofen with some relief. No new neurologic deficits. Doubt intracranial hemorrhage. Discussed return precautions with patient. She verbalized understanding and is comfortable with this plan. I will start her on metformin 500 mg once a day and hydrochlorothiazide 25 mg once a day and this can be adjusted by her primary care provider in 3 days.     EKG Interpretation  Date/Time:  Tuesday October 22 2015 01:09:43 EST Ventricular Rate:  95 PR Interval:  166 QRS Duration: 86 QT Interval:  411 QTC Calculation: 517 R Axis:   8 Text Interpretation:  Sinus rhythm Low voltage, precordial leads Probable anteroseptal infarct, old Borderline T abnormalities, inferior leads Prolonged QT interval No significant change since last tracing in 2006 Confirmed by Deanna Eichler,  DO, Keeshawn Fakhouri [phone]) on 10/22/2015 1:44:18 AM         I personally  performed the services described in this documentation, which was scribed in my presence. The recorded information has been reviewed and is accurate.    Deanna Maw Jamyria Ozanich, DO 10/22/15 0158  Deanna Maw Maclane Holloran, DO 10/22/15 0200

## 2015-10-22 NOTE — Discharge Instructions (Signed)
Hyperglycemia °Hyperglycemia occurs when the glucose (sugar) in your blood is too high. Hyperglycemia can happen for many reasons, but it most often happens to people who do not know they have diabetes or are not managing their diabetes properly.  °CAUSES  °Whether you have diabetes or not, there are other causes of hyperglycemia. Hyperglycemia can occur when you have diabetes, but it can also occur in other situations that you might not be as aware of, such as: °Diabetes °· If you have diabetes and are having problems controlling your blood glucose, hyperglycemia could occur because of some of the following reasons: °¨ Not following your meal plan. °¨ Not taking your diabetes medications or not taking it properly. °¨ Exercising less or doing less activity than you normally do. °¨ Being sick. °Pre-diabetes °· This cannot be ignored. Before people develop Type 2 diabetes, they almost always have "pre-diabetes." This is when your blood glucose levels are higher than normal, but not yet high enough to be diagnosed as diabetes. Research has shown that some long-term damage to the body, especially the heart and circulatory system, may already be occurring during pre-diabetes. If you take action to manage your blood glucose when you have pre-diabetes, you may delay or prevent Type 2 diabetes from developing. °Stress °· If you have diabetes, you may be "diet" controlled or on oral medications or insulin to control your diabetes. However, you may find that your blood glucose is higher than usual in the hospital whether you have diabetes or not. This is often referred to as "stress hyperglycemia." Stress can elevate your blood glucose. This happens because of hormones put out by the body during times of stress. If stress has been the cause of your high blood glucose, it can be followed regularly by your caregiver. That way he/she can make sure your hyperglycemia does not continue to get worse or progress to  diabetes. °Steroids °· Steroids are medications that act on the infection fighting system (immune system) to block inflammation or infection. One side effect can be a rise in blood glucose. Most people can produce enough extra insulin to allow for this rise, but for those who cannot, steroids make blood glucose levels go even higher. It is not unusual for steroid treatments to "uncover" diabetes that is developing. It is not always possible to determine if the hyperglycemia will go away after the steroids are stopped. A special blood test called an A1c is sometimes done to determine if your blood glucose was elevated before the steroids were started. °SYMPTOMS °· Thirsty. °· Frequent urination. °· Dry mouth. °· Blurred vision. °· Tired or fatigue. °· Weakness. °· Sleepy. °· Tingling in feet or leg. °DIAGNOSIS  °Diagnosis is made by monitoring blood glucose in one or all of the following ways: °· A1c test. This is a chemical found in your blood. °· Fingerstick blood glucose monitoring. °· Laboratory results. °TREATMENT  °First, knowing the cause of the hyperglycemia is important before the hyperglycemia can be treated. Treatment may include, but is not be limited to: °· Education. °· Change or adjustment in medications. °· Change or adjustment in meal plan. °· Treatment for an illness, infection, etc. °· More frequent blood glucose monitoring. °· Change in exercise plan. °· Decreasing or stopping steroids. °· Lifestyle changes. °HOME CARE INSTRUCTIONS  °· Test your blood glucose as directed. °· Exercise regularly. Your caregiver will give you instructions about exercise. Pre-diabetes or diabetes which comes on with stress is helped by exercising. °· Eat wholesome,   balanced meals. Eat often and at regular, fixed times. Your caregiver or nutritionist will give you a meal plan to guide your sugar intake.  Being at an ideal weight is important. If needed, losing as little as 10 to 15 pounds may help improve blood  glucose levels. SEEK MEDICAL CARE IF:   You have questions about medicine, activity, or diet.  You continue to have symptoms (problems such as increased thirst, urination, or weight gain). SEEK IMMEDIATE MEDICAL CARE IF:   You are vomiting or have diarrhea.  Your breath smells fruity.  You are breathing faster or slower.  You are very sleepy or incoherent.  You have numbness, tingling, or pain in your feet or hands.  You have chest pain.  Your symptoms get worse even though you have been following your caregiver's orders.  If you have any other questions or concerns.   This information is not intended to replace advice given to you by your health care provider. Make sure you discuss any questions you have with your health care provider.   Document Released: 02/24/2001 Document Revised: 11/23/2011 Document Reviewed: 05/07/2015 Elsevier Interactive Patient Education 2016 [employer].  Hypertension Hypertension, commonly called high blood pressure, is when the force of blood pumping through your arteries is too strong. Your arteries are the blood vessels that carry blood from your heart throughout your body. A blood pressure reading consists of a higher number over a lower number, such as 110/72. The higher number (systolic) is the pressure inside your arteries when your heart pumps. The lower number (diastolic) is the pressure inside your arteries when your heart relaxes. Ideally you want your blood pressure below 120/80. Hypertension forces your heart to work harder to pump blood. Your arteries may become narrow or stiff. Having untreated or uncontrolled hypertension can cause heart attack, stroke, kidney disease, and other problems. RISK FACTORS Some risk factors for high blood pressure are controllable. Others are not.  Risk factors you cannot control include:   Race. You may be at higher risk if you are African American.  Age. Risk increases with age.  Gender. Men are at  higher risk than women before age 28 years. After age 47, women are at higher risk than men. Risk factors you can control include:  Not getting enough exercise or physical activity.  Being overweight.  Getting too much fat, sugar, calories, or salt in your diet.  Drinking too much alcohol. SIGNS AND SYMPTOMS Hypertension does not usually cause signs or symptoms. Extremely high blood pressure (hypertensive crisis) may cause headache, anxiety, shortness of breath, and nosebleed. DIAGNOSIS To check if you have hypertension, your health care provider will measure your blood pressure while you are seated, with your arm held at the level of your heart. It should be measured at least twice using the same arm. Certain conditions can cause a difference in blood pressure between your right and left arms. A blood pressure reading that is higher than normal on one occasion does not mean that you need treatment. If it is not clear whether you have high blood pressure, you may be asked to return on a different day to have your blood pressure checked again. Or, you may be asked to monitor your blood pressure at home for 1 or more weeks. TREATMENT Treating high blood pressure includes making lifestyle changes and possibly taking medicine. Living a healthy lifestyle can help lower high blood pressure. You may need to change some of your habits. Lifestyle changes may include:  Following the DASH diet. This diet is high in fruits, vegetables, and whole grains. It is low in salt, red meat, and added sugars.  Keep your sodium intake below 2,300 mg per day.  Getting at least 30-45 minutes of aerobic exercise at least 4 times per week.  Losing weight if necessary.  Not smoking.  Limiting alcoholic beverages.  Learning ways to reduce stress. Your health care provider may prescribe medicine if lifestyle changes are not enough to get your blood pressure under control, and if one of the following is true:  You  are 47-83 years of age and your systolic blood pressure is above 140.  You are 6 years of age or older, and your systolic blood pressure is above 150.  Your diastolic blood pressure is above 90.  You have diabetes, and your systolic blood pressure is over 140 or your diastolic blood pressure is over 90.  You have kidney disease and your blood pressure is above 140/90.  You have heart disease and your blood pressure is above 140/90. Your personal target blood pressure may vary depending on your medical conditions, your age, and other factors. HOME CARE INSTRUCTIONS  Have your blood pressure rechecked as directed by your health care provider.   Take medicines only as directed by your health care provider. Follow the directions carefully. Blood pressure medicines must be taken as prescribed. The medicine does not work as well when you skip doses. Skipping doses also puts you at risk for problems.  Do not smoke.   Monitor your blood pressure at home as directed by your health care provider. SEEK MEDICAL CARE IF:   You think you are having a reaction to medicines taken.  You have recurrent headaches or feel dizzy.  You have swelling in your ankles.  You have trouble with your vision. SEEK IMMEDIATE MEDICAL CARE IF:  You develop a severe headache or confusion.  You have unusual weakness, numbness, or feel faint.  You have severe chest or abdominal pain.  You vomit repeatedly.  You have trouble breathing. MAKE SURE YOU:   Understand these instructions.  Will watch your condition.  Will get help right away if you are not doing well or get worse.   This information is not intended to replace advice given to you by your health care provider. Make sure you discuss any questions you have with your health care provider.   Document Released: 08/31/2005 Document Revised: 01/15/2015 Document Reviewed: 06/23/2013 Elsevier Interactive Patient Education 2016 Else[employer]  Inc[employer]  Nonspecific Chest Pain  Chest pain can be caused by many different conditions. There is always a chance that your pain could be related to something serious, such as a heart attack or a blood clot in your lungs. Chest pain can also be caused by conditions that are not life-threatening. If you have chest pain, it is very important to follow up with your health care provider. CAUSES  Chest pain can be caused by:  Heartburn.  Pneumonia or bronchitis.  Anxiety or stress.  Inflammation around your heart (pericarditis) or lung (pleuritis or pleurisy).  A blood clot in your lung.  A collapsed lung (pneumothorax). It can develop suddenly on its own (spontaneous pneumothorax) or from trauma to the chest.  Shingles infection (varicella-zoster virus).  Heart attack.  Damage to the bones, muscles, and cartilage that make up your chest wall. This can include:  Bruised bones due to injury.  Strained muscles or cartilage due to frequent or repeated coughing or overwork.  Fracture to one or more ribs.  Sore cartilage due to inflammation (costochondritis). RISK FACTORS  Risk factors for chest pain may include:  Activities that increase your risk for trauma or injury to your chest.  Respiratory infections or conditions that cause frequent coughing.  Medical conditions or overeating that can cause heartburn.  Heart disease or family history of heart disease.  Conditions or health behaviors that increase your risk of developing a blood clot.  Having had chicken pox (varicella zoster). SIGNS AND SYMPTOMS Chest pain can feel like:  Burning or tingling on the surface of your chest or deep in your chest.  Crushing, pressure, aching, or squeezing pain.  Dull or sharp pain that is worse when you move, cough, or take a deep breath.  Pain that is also felt in your back, neck, shoulder, or arm, or pain that spreads to any of these areas. Your chest pain may come and go, or it may  stay constant. DIAGNOSIS Lab tests or other studies may be needed to find the cause of your pain. Your health care provider may have you take a test called an ambulatory ECG (electrocardiogram). An ECG records your heartbeat patterns at the time the test is performed. You may also have other tests, such as:  Transthoracic echocardiogram (TTE). During echocardiography, sound waves are used to create a picture of all of the heart structures and to look at how blood flows through your heart.  Transesophageal echocardiogram (TEE).This is a more advanced imaging test that obtains images from inside your body. It allows your health care provider to see your heart in finer detail.  Cardiac monitoring. This allows your health care provider to monitor your heart rate and rhythm in real time.  Holter monitor. This is a portable device that records your heartbeat and can help to diagnose abnormal heartbeats. It allows your health care provider to track your heart activity for several days, if needed.  Stress tests. These can be done through exercise or by taking medicine that makes your heart beat more quickly.  Blood tests.  Imaging tests. TREATMENT  Your treatment depends on what is causing your chest pain. Treatment may include:  Medicines. These may include:  Acid blockers for heartburn.  Anti-inflammatory medicine.  Pain medicine for inflammatory conditions.  Antibiotic medicine, if an infection is present.  Medicines to dissolve blood clots.  Medicines to treat coronary artery disease.  Supportive care for conditions that do not require medicines. This may include:  Resting.  Applying heat or cold packs to injured areas.  Limiting activities until pain decreases. HOME CARE INSTRUCTIONS  If you were prescribed an antibiotic medicine, finish it all even if you start to feel better.  Avoid any activities that bring on chest pain.  Do not use any tobacco products, including  cigarettes, chewing tobacco, or electronic cigarettes. If you need help quitting, ask your health care provider.  Do not drink alcohol.  Take medicines only as directed by your health care provider.  Keep all follow-up visits as directed by your health care provider. This is important. This includes any further testing if your chest pain does not go away.  If heartburn is the cause for your chest pain, you may be told to keep your head raised (elevated) while sleeping. This reduces the chance that acid will go from your stomach into your esophagus.  Make lifestyle changes as directed by your health care provider. These may include:  Getting regular exercise. Ask your health care  provider to suggest some activities that are safe for you.  Eating a heart-healthy diet. A registered dietitian can help you to learn healthy eating options.  Maintaining a healthy weight.  Managing diabetes, if necessary.  Reducing stress. SEEK MEDICAL CARE IF:  Your chest pain does not go away after treatment.  You have a rash with blisters on your chest.  You have a fever. SEEK IMMEDIATE MEDICAL CARE IF:   Your chest pain is worse.  You have an increasing cough, or you cough up blood.  You have severe abdominal pain.  You have severe weakness.  You faint.  You have chills.  You have sudden, unexplained chest discomfort.  You have sudden, unexplained discomfort in your arms, back, neck, or jaw.  You have shortness of breath at any time.  You suddenly start to sweat, or your skin gets clammy.  You feel nauseous or you vomit.  You suddenly feel light-headed or dizzy.  Your heart begins to beat quickly, or it feels like it is skipping beats. These symptoms may represent a serious problem that is an emergency. Do not wait to see if the symptoms will go away. Get medical help right away. Call your local emergency services (911 in the [place] Do not drive yourself to the hospital.   This  information is not intended to replace advice given to you by your health care provider. Make sure you discuss any questions you have with your health care provider.   Document Released: 06/10/2005 Document Revised: 09/21/2014 Document Reviewed: 04/06/2014 Elsevier Interactive Patient Education [employer]

## 2015-11-28 ENCOUNTER — Encounter: Payer: BLUE CROSS/BLUE SHIELD | Attending: Family Medicine

## 2015-11-28 VITALS — Ht 65.0 in | Wt 205.0 lb

## 2015-11-28 DIAGNOSIS — E1165 Type 2 diabetes mellitus with hyperglycemia: Secondary | ICD-10-CM

## 2015-11-28 DIAGNOSIS — E089 Diabetes mellitus due to underlying condition without complications: Secondary | ICD-10-CM | POA: Diagnosis present

## 2015-11-28 DIAGNOSIS — E118 Type 2 diabetes mellitus with unspecified complications: Secondary | ICD-10-CM | POA: Diagnosis present

## 2015-11-28 DIAGNOSIS — IMO0002 Reserved for concepts with insufficient information to code with codable children: Secondary | ICD-10-CM

## 2015-11-28 NOTE — Progress Notes (Signed)
Patient was seen on 11/28/15 for the first of a series of three diabetes self-management courses at the [facility].  Patient Education Plan per assessed needs and concerns is to attend four course education program for Diabetes Self Management Education.  The following learning objectives were met by the patient during this class:  Describe diabetes  State some common risk factors for diabetes  Defines the role of glucose and insulin  Identifies type of diabetes and pathophysiology  Describe the relationship between diabetes and cardiovascular risk  State the members of the Healthcare Team  States the rationale for glucose monitoring  State when to test glucose  State their individual Target Range  State the importance of logging glucose readings  Describe how to interpret glucose readings  Identifies A1C target  Explain the correlation between A1c and eAG values  State symptoms and treatment of high blood glucose  State symptoms and treatment of low blood glucose  Explain proper technique for glucose testing  Identifies proper sharps disposal  Handouts given during class include:  Living Well with Diabetes book  Carb Counting and Meal Planning book  Meal Plan Card  Carbohydrate guide  Meal planning worksheet  Low Sodium Flavoring Tips  The diabetes portion plate  K7Z to eAG Conversion Chart  Diabetes Medications  Diabetes Recommended Care Schedule  Support Group  Diabetes Success Plan  Core Class Satisfaction Survey  Follow-Up Plan:  Attend core 2

## 2015-12-05 DIAGNOSIS — E119 Type 2 diabetes mellitus without complications: Secondary | ICD-10-CM

## 2015-12-05 DIAGNOSIS — E118 Type 2 diabetes mellitus with unspecified complications: Secondary | ICD-10-CM | POA: Diagnosis not present

## 2015-12-05 NOTE — Progress Notes (Signed)

## 2015-12-12 ENCOUNTER — Encounter: Payer: Self-pay | Admitting: Family Medicine

## 2015-12-12 DIAGNOSIS — E119 Type 2 diabetes mellitus without complications: Secondary | ICD-10-CM

## 2015-12-12 DIAGNOSIS — E118 Type 2 diabetes mellitus with unspecified complications: Secondary | ICD-10-CM | POA: Diagnosis not present

## 2015-12-17 NOTE — Progress Notes (Signed)
Patient was seen on 12/12/15 for the third of a series of three diabetes self-management courses at the [facility].   [name]. State the amount of activity recommended for healthy living . Describe activities suitable for individual needs . Identify ways to regularly incorporate activity into daily life . Identify barriers to activity and ways to over come these barriers  Identify diabetes medications being personally used and their primary action for lowering glucose and possible side effects . Describe role of stress on blood glucose and develop strategies to address psychosocial issues . Identify diabetes complications and ways to prevent them  Explain how to manage diabetes during illness . Evaluate success in meeting personal goal . Establish 2-3 goals that they will plan to diligently work on until they return for the  6624-month follow-up visit  Goals:   I will count my carb choices at most meals and snacks  I will be active 30 minutes or more 5 times a week  I will take my diabetes medications as scheduled  I will eat less unhealthy fats by eating less carbs and sugar  I will test my glucose at least 4 times a day, 7 days a week  I will look at patterns in my record book at least 4 days a month  To help manage stress I will  exercise at least 5 times a week  Your patient has identified these potential barriers to change:  None noted  Your patient has identified their diabetes self-care support plan as  [facility] Support [facility] Plan:  Attend Support Group as desired

## 2018-05-17 ENCOUNTER — Ambulatory Visit: Payer: BLUE CROSS/BLUE SHIELD | Admitting: Registered"

## 2019-05-05 ENCOUNTER — Other Ambulatory Visit: Payer: Self-pay | Admitting: Physician Assistant

## 2019-05-05 ENCOUNTER — Other Ambulatory Visit (HOSPITAL_COMMUNITY)
Admission: RE | Admit: 2019-05-05 | Discharge: 2019-05-05 | Disposition: A | Discharge status: Home / Self Care | Payer: BC Managed Care – PPO | Source: Ambulatory Visit | Attending: Physician Assistant | Admitting: Physician Assistant

## 2019-05-05 DIAGNOSIS — Z0000 Encounter for general adult medical examination without abnormal findings: Secondary | ICD-10-CM | POA: Diagnosis present

## 2019-05-08 LAB — CYTOLOGY - PAP
Diagnosis: NEGATIVE
HPV: NOT DETECTED

## 2020-10-01 ENCOUNTER — Other Ambulatory Visit: Payer: Self-pay | Admitting: *Deleted

## 2020-10-02 ENCOUNTER — Ambulatory Visit: Payer: BC Managed Care – PPO | Admitting: Pulmonary Disease

## 2020-10-02 ENCOUNTER — Encounter: Payer: Self-pay | Admitting: Pulmonary Disease

## 2020-10-02 ENCOUNTER — Other Ambulatory Visit: Payer: Self-pay

## 2020-10-02 VITALS — BP 120/70 | HR 92 | Temp 98.0°F | Ht 65.0 in | Wt 188.0 lb

## 2020-10-02 DIAGNOSIS — J4550 Severe persistent asthma, uncomplicated: Secondary | ICD-10-CM

## 2020-10-02 DIAGNOSIS — R0602 Shortness of breath: Secondary | ICD-10-CM

## 2020-10-02 DIAGNOSIS — R059 Cough, unspecified: Secondary | ICD-10-CM

## 2020-10-02 DIAGNOSIS — R062 Wheezing: Secondary | ICD-10-CM | POA: Diagnosis not present

## 2020-10-02 MED ORDER — BREZTRI AEROSPHERE 160-9-4.8 MCG/ACT IN AERO: 2.0000 | INHALATION_SPRAY | Freq: Two times a day (BID) | RESPIRATORY_TRACT | 5 refills | Status: DC

## 2020-10-02 MED ORDER — BREZTRI AEROSPHERE 160-9-4.8 MCG/ACT IN AERO: 2.0000 | INHALATION_SPRAY | Freq: Two times a day (BID) | RESPIRATORY_TRACT | 0 refills | Status: DC

## 2020-10-02 MED ORDER — ALBUTEROL SULFATE (2.5 MG/3ML) 0.083% IN NEBU: 2.5000 mg | INHALATION_SOLUTION | Freq: Four times a day (QID) | RESPIRATORY_TRACT | 12 refills | Status: AC | PRN

## 2020-10-02 MED ORDER — ALBUTEROL SULFATE HFA 108 (90 BASE) MCG/ACT IN AERS: 2.0000 | INHALATION_SPRAY | Freq: Four times a day (QID) | RESPIRATORY_TRACT | 11 refills | Status: DC | PRN

## 2020-10-02 MED ORDER — MONTELUKAST SODIUM 10 MG PO TABS: 10.0000 mg | ORAL_TABLET | Freq: Every day | ORAL | 11 refills | Status: DC

## 2020-10-02 NOTE — Patient Instructions (Addendum)
Thank you for visiting Dr. Tonia Brooms at [facility]. Today we recommend the following:  Orders Placed This Encounter  Procedures  . Ambulatory Referral for DME  . Pulmonary Function Test   Meds ordered this encounter  Medications  . montelukast (SINGULAIR) 10 MG tablet    Sig: Take 1 tablet (10 mg total) by mouth at bedtime.    Dispense:  30 tablet    Refill:  11  . Budeson-Glycopyrrol-Formoterol (BREZTRI AEROSPHERE) 160-9-4.8 MCG/ACT AERO    Sig: Inhale 2 puffs into the lungs in the morning and at bedtime.    Dispense:  10.7 g    Refill:  5  . albuterol (VENTOLIN HFA) 108 (90 Base) MCG/ACT inhaler    Sig: Inhale 2 puffs into the lungs every 6 (six) hours as needed.    Dispense:  18 g    Refill:  11  . albuterol (PROVENTIL) (2.5 MG/3ML) 0.083% nebulizer solution    Sig: Take 3 mLs (2.5 mg total) by nebulization every 6 (six) hours as needed for wheezing or shortness of breath.    Dispense:  75 mL    Refill:  12   Breztri Samples today with spacer   Return in about 6 weeks (around 11/13/2020) for with APP or Dr. Tonia Brooms.    Please do your part to reduce the spread of COVID-19.

## 2020-10-02 NOTE — Addendum Note (Signed)
Addended by: Marcellus Scott on: 10/02/2020 04:07 PM   Modules accepted: Orders

## 2020-10-02 NOTE — Progress Notes (Signed)
Synopsis: Referred in January 2022 for moderate persistent asthma by Scifres, Deanna Cella, PA-C  Subjective:   PATIENT ID: [name] GENDER: female DOB: 1970/09/21, MRN: [id number]  Chief Complaint  Patient presents with  . Consult    Asthma as a child.  Over the last year she has been having a cough and recently had to use her inhaler more often during the day.  She recently went back to work     This is a 50 year old female, past medical history of asthma, diabetes referred for evaluation for moderate persistent asthma symptoms.  No prior PFTs on file.  PA lateral chest x-ray in March 2019 with no infiltrate or effusions no active disease.  Report reviewed in epic care everywhere as was completed at [facility]. She has had asthma dx since she was a kid. She was on inhalers, she uses her albuterol 10 times per day. Not currently on any maintence daily inhaler. Symptoms are worse at night. She has seasonal allergies.  She has never been hospitalized.  She has had a persistent cough for the past year.  She is on lisinopril for blood pressure management but she also has uncontrolled asthma symptoms.  Patient had blood work on 08/14/2020 at primary care office visit which reveals a eosinophil count of 500, 8.3% eos.   Past Medical History:  Diagnosis Date  . Asthma   . Diabetes mellitus without complication (HCC)      No family history on file.   No past surgical history on file.  Social History   Socioeconomic History  . Marital status: Married    Spouse name: Not on file  . Number of children: Not on file  . Years of education: Not on file  . Highest education level: Not on file  Occupational History  . Not on file  Tobacco Use  . Smoking status: Never Smoker  . Smokeless tobacco: Never Used  Substance and Sexual Activity  . Alcohol use: No  . Drug use: No  . Sexual activity: Not on file  Other Topics Concern  . Not on file  Social History Narrative  . Not on file    Social Determinants of Health   Financial Resource Strain: Not on file  Food Insecurity: Not on file  Transportation Needs: Not on file  Physical Activity: Not on file  Stress: Not on file  Social Connections: Not on file  Intimate Partner Violence: Not on file     No Known Allergies   Outpatient Medications Prior to Visit  Medication Sig Dispense Refill  . albuterol (VENTOLIN HFA) 108 (90 Base) MCG/ACT inhaler Inhale 2 puffs into the lungs every 6 (six) hours as needed.    . hydrochlorothiazide (HYDRODIURIL) 25 MG tablet Take 1 tablet (25 mg total) by mouth daily. 7 tablet 0  . JARDIANCE 10 MG TABS tablet Take 10 mg by mouth daily.    . metFORMIN (GLUCOPHAGE) 500 MG tablet Take 1 tablet (500 mg total) by mouth daily with breakfast. 7 tablet 0  . nebivolol (BYSTOLIC) 10 MG tablet     . rosuvastatin (CRESTOR) 10 MG tablet Take 10 mg by mouth at bedtime.    . Ascorbic Acid (VITAMIN C PO) Take by mouth daily. (Patient not taking: Reported on 10/02/2020)    . Cholecalciferol (VITAMIN D3 PO) Take by mouth daily. (Patient not taking: Reported on 10/02/2020)    . cyclobenzaprine (FLEXERIL) 10 MG tablet Take 10 mg by mouth 3 (three) times daily as needed. (  Patient not taking: Reported on 10/02/2020)    . Multiple Vitamin (MULTIVITAMIN) tablet Take 1 tablet by mouth daily. (Patient not taking: Reported on 10/02/2020)    . omega-3 acid ethyl esters (LOVAZA) 1 g capsule Take by mouth 2 (two) times daily. (Patient not taking: Reported on 10/02/2020)     No facility-administered medications prior to visit.    Review of Systems  Constitutional: Negative for chills, fever, malaise/fatigue and weight loss.  HENT: Negative for hearing loss, sore throat and tinnitus.   Eyes: Negative for blurred vision and double vision.  Respiratory: Positive for cough, shortness of breath and wheezing. Negative for hemoptysis, sputum production and stridor.   Cardiovascular: Negative for chest pain, palpitations,  orthopnea, leg swelling and PND.  Gastrointestinal: Negative for abdominal pain, constipation, diarrhea, heartburn, nausea and vomiting.  Genitourinary: Negative for dysuria, hematuria and urgency.  Musculoskeletal: Negative for joint pain and myalgias.  Skin: Negative for itching and rash.  Neurological: Negative for dizziness, tingling, weakness and headaches.  Endo/Heme/Allergies: Negative for environmental allergies. Does not bruise/bleed easily.  Psychiatric/Behavioral: Negative for depression. The patient is not nervous/anxious and does not have insomnia.   All other systems reviewed and are negative.    Objective:  Physical Exam Vitals reviewed.  Constitutional:      General: She is not in acute distress.    Appearance: She is well-developed and well-nourished.  HENT:     Head: Normocephalic and atraumatic.     Mouth/Throat:     Mouth: Oropharynx is clear and moist.  Eyes:     General: No scleral icterus.    Conjunctiva/sclera: Conjunctivae normal.     Pupils: Pupils are equal, round, and reactive to light.  Neck:     Vascular: No JVD.     Trachea: No tracheal deviation.  Cardiovascular:     Rate and Rhythm: Normal rate and regular rhythm.     Pulses: Intact distal pulses.     Heart sounds: Normal heart sounds. No murmur heard.   Pulmonary:     Effort: Pulmonary effort is normal. No tachypnea, accessory muscle usage or respiratory distress.     Breath sounds: No stridor. No wheezing, rhonchi or rales.  Abdominal:     General: Bowel sounds are normal. There is no distension.     Palpations: Abdomen is soft.     Tenderness: There is no abdominal tenderness.  Musculoskeletal:        General: No tenderness or edema.     Cervical back: Neck supple.  Lymphadenopathy:     Cervical: No cervical adenopathy.  Skin:    General: Skin is warm and dry.     Capillary Refill: Capillary refill takes less than 2 seconds.     Findings: No rash.  Neurological:     Mental  Status: She is alert and oriented to person, place, and time.  Psychiatric:        Mood and Affect: Mood and affect normal.        Behavior: Behavior normal.      Vitals:   10/02/20 1428  BP: 120/70  Pulse: 92  Temp: 98 F (36.7 C)  TempSrc: Tympanic  SpO2: 94%  Weight: 188 lb (85.3 kg)  Height: 5\' 5"  (1.651 m)   94% on RA BMI Readings from Last 3 Encounters:  10/02/20 31.28 kg/m  11/28/15 34.11 kg/m   Wt Readings from Last 3 Encounters:  10/02/20 188 lb (85.3 kg)  11/28/15 205 lb (93 kg)  CBC    Component Value Date/Time   WBC 7.6 10/21/2015 1741   RBC 5.04 10/21/2015 1741   HGB 14.0 10/21/2015 1741   HCT 43.4 10/21/2015 1741   PLT 374 10/21/2015 1741   MCV 86.1 10/21/2015 1741   MCH 27.8 10/21/2015 1741   MCHC 32.3 10/21/2015 1741   RDW 13.2 10/21/2015 1741   LYMPHSABS 1.8 09/08/2008 2109   MONOABS 0.5 09/08/2008 2109   EOSABS 0.2 09/08/2008 2109   BASOSABS 0.0 09/08/2008 2109    Chest Imaging: No recent chest imaging   Pulmonary Functions Testing Results: No flowsheet data found.  FeNO:   Pathology:   Echocardiogram:   Heart Catheterization:     Assessment & Plan:     ICD-10-CM   1. Severe persistent asthma without complication  J45.50 Pulmonary Function Test    Ambulatory Referral for DME  2. SOB (shortness of breath)  R06.02   3. Wheezing  R06.2   4. Cough  R05.9     Discussion: This is a 50 year old female, longstanding history of asthma since childhood, non-smoker.  Presents with ongoing symptoms, nocturnal symptoms of cough shortness of breath wheezing.  She has had a cough for greater than a year, blood pressure currently managed with lisinopril however she has had ongoing uncontrolled asthma symptoms.  We did discuss potentially talking with her PCP about coming off of lisinopril during this time and replacing with another blood pressure medication.  Of note she does have 8.3% eosinophils on previous blood work from PCP,  absolute eosinophil count 500.  As for her asthma symptoms I think we should do the following  Plan: Start patient on maintenance inhaler, Breztri 2 puffs twice daily, samples and new prescription plus prescription cover card given today. Start patient on Singulair Continue use of albuterol as needed New prescription for albuterol nebulized solution plus DME supply orders for new nebulizer plus tubing.  Recommend following up with us in approximately 4 to 6 weeks.    Current Outpatient Medications:  .  albuterol (VENTOLIN HFA) 108 (90 Base) MCG/ACT inhaler, Inhale 2 puffs into the lungs every 6 (six) hours as needed., Disp: , Rfl:  .  hydrochlorothiazide (HYDRODIURIL) 25 MG tablet, Take 1 tablet (25 mg total) by mouth daily., Disp: 7 tablet, Rfl: 0 .  JARDIANCE 10 MG TABS tablet, Take 10 mg by mouth daily., Disp: , Rfl:  .  metFORMIN (GLUCOPHAGE) 500 MG tablet, Take 1 tablet (500 mg total) by mouth daily with breakfast., Disp: 7 tablet, Rfl: 0 .  nebivolol (BYSTOLIC) 10 MG tablet, , Disp: , Rfl:  .  rosuvastatin (CRESTOR) 10 MG tablet, Take 10 mg by mouth at bedtime., Disp: , Rfl:  .  Ascorbic Acid (VITAMIN C PO), Take by mouth daily. (Patient not taking: Reported on 10/02/2020), Disp: , Rfl:  .  Cholecalciferol (VITAMIN D3 PO), Take by mouth daily. (Patient not taking: Reported on 10/02/2020), Disp: , Rfl:  .  cyclobenzaprine (FLEXERIL) 10 MG tablet, Take 10 mg by mouth 3 (three) times daily as needed. (Patient not taking: Reported on 10/02/2020), Disp: , Rfl:  .  Multiple Vitamin (MULTIVITAMIN) tablet, Take 1 tablet by mouth daily. (Patient not taking: Reported on 10/02/2020), Disp: , Rfl:  .  omega-3 acid ethyl esters (LOVAZA) 1 g capsule, Take by mouth 2 (two) times daily. (Patient not taking: Reported on 10/02/2020), Disp: , Rfl:   I spent 62 minutes dedicated to the care of this patient on the date of this encounter to include  pre-visit review of records, face-to-face time with the  patient discussing conditions above, post visit ordering of testing, clinical documentation with the electronic health record, making appropriate referrals as documented, and communicating necessary findings to members of the patients care team.   Josephine Igo, DO [facility] 10/02/2020 2:42 PM

## 2020-11-13 ENCOUNTER — Ambulatory Visit: Payer: BC Managed Care – PPO | Admitting: Primary Care

## 2020-11-19 ENCOUNTER — Ambulatory Visit: Payer: BC Managed Care – PPO | Admitting: Primary Care

## 2020-11-19 ENCOUNTER — Encounter: Payer: Self-pay | Admitting: Primary Care

## 2020-11-19 ENCOUNTER — Other Ambulatory Visit: Payer: Self-pay

## 2020-11-19 DIAGNOSIS — J4550 Severe persistent asthma, uncomplicated: Secondary | ICD-10-CM

## 2020-11-19 MED ORDER — BREZTRI AEROSPHERE 160-9-4.8 MCG/ACT IN AERO: 2.0000 | INHALATION_SPRAY | Freq: Two times a day (BID) | RESPIRATORY_TRACT | 11 refills | Status: DC

## 2020-11-19 NOTE — Progress Notes (Signed)
@Patient  ID: , female    DOB: Apr 19, 1971, 50 y.o.   MRN: [id number]  Chief Complaint  Patient presents with  . Follow-up    Pt states she has improved some since last visit. States cough is some better and states she still has some SOB.    Referring provider: Scifres, [id number]  HPI: 50 year old female, never smoked. Past medical history significant for severe persistent asthma.  Patient of Dr. 44, seen for initial consult on 10/02/2020.  Started on Burns two puff twice daily. Ordered for PFTs.   11/19/2020  Patient presents today for 6 to 8-week follow-up.  She is doing well, no acute symptoms.  Patient reports significant improvement in her breathing and cough since starting Breztri. She still experiences a dry cough and some shortness of breath with exertion. She has been requiring her rescue inhaler less. On average she uses Albuterol once a day, compared to 8 times a day previously. Denies active chest tightness or wheezing.    No Known Allergies  Immunization History  Administered Date(s) Administered  . Influenza Split 07/18/2019, 07/05/2020  . Influenza,inj,Quad PF,6+ Mos 07/18/2019  . PFIZER(Purple Top)SARS-COV-2 Vaccination 07/05/2020  . Tdap 05/05/2019    Past Medical History:  Diagnosis Date  . Asthma   . Diabetes mellitus without complication (HCC)     Tobacco History: Social History   Tobacco Use  Smoking Status Never Smoker  Smokeless Tobacco Never Used   Counseling given: Not Answered   Outpatient Medications Prior to Visit  Medication Sig Dispense Refill  . albuterol (PROVENTIL) (2.5 MG/3ML) 0.083% nebulizer solution Take 3 mLs (2.5 mg total) by nebulization every 6 (six) hours as needed for wheezing or shortness of breath. 75 mL 12  . albuterol (VENTOLIN HFA) 108 (90 Base) MCG/ACT inhaler Inhale 2 puffs into the lungs every 6 (six) hours as needed. 18 g 11  . Ascorbic Acid (VITAMIN C PO) Take by mouth daily.    05/07/2019  CALCIUM-VITAMIN D PO Take 1 capsule by mouth daily.    . hydrochlorothiazide (HYDRODIURIL) 25 MG tablet Take 1 tablet (25 mg total) by mouth daily. 7 tablet 0  . JARDIANCE 10 MG TABS tablet Take 10 mg by mouth daily.    . metFORMIN (GLUCOPHAGE) 500 MG tablet Take 1 tablet (500 mg total) by mouth daily with breakfast. 7 tablet 0  . montelukast (SINGULAIR) 10 MG tablet Take 1 tablet (10 mg total) by mouth at bedtime. 30 tablet 11  . Multiple Vitamin (MULTIVITAMIN) tablet Take 1 tablet by mouth daily.    . nebivolol (BYSTOLIC) 10 MG tablet     . omega-3 acid ethyl esters (LOVAZA) 1 g capsule Take by mouth 2 (two) times daily.    . rosuvastatin (CRESTOR) 10 MG tablet Take 10 mg by mouth at bedtime.    . Budeson-Glycopyrrol-Formoterol (BREZTRI AEROSPHERE) 160-9-4.8 MCG/ACT AERO Inhale 2 puffs into the lungs in the morning and at bedtime. 10.7 g 5  . Budeson-Glycopyrrol-Formoterol (BREZTRI AEROSPHERE) 160-9-4.8 MCG/ACT AERO Inhale 2 puffs into the lungs in the morning and at bedtime. 9.6 g 0  . Cholecalciferol (VITAMIN D3 PO) Take by mouth daily. (Patient not taking: Reported on 10/02/2020)    . cyclobenzaprine (FLEXERIL) 10 MG tablet Take 10 mg by mouth 3 (three) times daily as needed. (Patient not taking: Reported on 10/02/2020)     No facility-administered medications prior to visit.   Review of Systems  Review of Systems  Constitutional: Negative.   HENT: Negative.  Respiratory: Positive for cough. Negative for chest tightness, shortness of breath and wheezing.   Cardiovascular: Negative.    Physical Exam  BP 118/78 (BP Location: Left Arm, Cuff Size: Normal)   Pulse 82   Temp (!) 97.1 F (36.2 C) (Temporal)   Ht 5\' 5"  (1.651 m)   Wt 188 lb 3.2 oz (85.4 kg)   SpO2 95% Comment: RA  BMI 31.32 kg/m  Physical Exam Constitutional:      Appearance: Normal appearance.  HENT:     Head: Normocephalic and atraumatic.     Mouth/Throat:     Mouth: Mucous membranes are moist.     Pharynx:  Oropharynx is clear.  Cardiovascular:     Rate and Rhythm: Normal rate and regular rhythm.  Pulmonary:     Effort: Pulmonary effort is normal.     Breath sounds: Normal breath sounds.     Comments: CTA Musculoskeletal:        General: Normal range of motion.     Cervical back: Normal range of motion and neck supple.  Skin:    General: Skin is warm and dry.  Neurological:     Mental Status: She is alert.  Psychiatric:        Mood and Affect: Mood normal.        Behavior: Behavior normal.        Thought Content: Thought content normal.        Judgment: Judgment normal.      Lab Results:  CBC    Component Value Date/Time   WBC 7.6 10/21/2015 1741   RBC 5.04 10/21/2015 1741   HGB 14.0 10/21/2015 1741   HCT 43.4 10/21/2015 1741   PLT 374 10/21/2015 1741   MCV 86.1 10/21/2015 1741   MCH 27.8 10/21/2015 1741   MCHC 32.3 10/21/2015 1741   RDW 13.2 10/21/2015 1741   LYMPHSABS 1.8 09/08/2008 2109   MONOABS 0.5 09/08/2008 2109   EOSABS 0.2 09/08/2008 2109   BASOSABS 0.0 09/08/2008 2109    BMET    Component Value Date/Time   NA 135 10/21/2015 1741   K 3.9 10/21/2015 1741   CL 97 (L) 10/21/2015 1741   CO2 26 10/21/2015 1741   GLUCOSE 300 (H) 10/21/2015 1741   BUN 13 10/21/2015 1741   CREATININE 0.57 10/21/2015 1741   CALCIUM 9.3 10/21/2015 1741   GFRNONAA >60 10/21/2015 1741   GFRAA >60 10/21/2015 1741    BNP No results found for: BNP  ProBNP No results found for: PROBNP  Imaging: No results found.   Assessment & Plan:   Severe persistent asthma - Significantly improved on triple therapy. Requiring SABA once daily. ACT 14 (5)  - Continue Breztri two puffs q12 hours, Singulair 10mg  at bedtime and prn Albuterol 2 puffs q 6 hours  - FU in 6 months with PFTs    12/19/2015, NP 11/19/2020

## 2020-11-19 NOTE — Patient Instructions (Addendum)
Recommendations: Continue Breztri two puff morning and evening (rinse mouth after use) Continue Singulair 10mg  at bedtime Use albuterol 2 puffs every 4-6 hours for breakthrough shortness of breath/wheezing If you noticed colored mucus, worsening sob/wheezing/chest tightness notify office   Follow-up: FU in 6 months with Dr. and PFTs    [email or web address]  Asthma, Adult  Asthma is a long-term (chronic) condition that causes recurrent episodes in which the airways become tight and narrow. The airways are the passages that lead from the nose and mouth down into the lungs. Asthma episodes, also called asthma attacks, can cause coughing, wheezing, shortness of breath, and chest pain. The airways can also fill with mucus. During an attack, it can be difficult to breathe. Asthma attacks can range from minor to life threatening. Asthma cannot be cured, but medicines and lifestyle changes can help control it and treat acute attacks. What are the causes? This condition is believed to be caused by inherited (genetic) and environmental factors, but its exact cause is not known. There are many things that can bring on an asthma attack or make asthma symptoms worse (triggers). Asthma triggers are different for each person. Common triggers include:  Mold.  Dust.  Cigarette smoke.  Cockroaches.  Things that can cause allergy symptoms (allergens), such as animal dander or pollen from trees or grass.  Air pollutants such as household cleaners, wood smoke, smog, or Tonia Brooms.  Cold air, weather changes, and winds (which increase molds and pollen in the air).  Strong emotional expressions such as crying or laughing hard.  Stress.  Certain medicines (such as aspirin) or types of medicines (such as beta-blockers).  Sulfites in foods and drinks. Foods and drinks that may contain sulfites include dried fruit, potato chips, and sparkling grape  juice.  Infections or inflammatory conditions such as the flu, a cold, or inflammation of the nasal membranes (rhinitis).  Gastroesophageal reflux disease (GERD).  Exercise or strenuous activity. What are the signs or symptoms? Symptoms of this condition may occur right after asthma is triggered or many hours later. Symptoms include:  Wheezing. This can sound like whistling when you breathe.  Excessive nighttime or early morning coughing.  Frequent or severe coughing with a common cold.  Chest tightness.  Shortness of breath.  Tiredness (fatigue) with minimal activity. How is this diagnosed? This condition is diagnosed based on:  Your medical history.  A physical exam.  Tests, which may include: ? Lung function studies and pulmonary studies (spirometry). These tests can evaluate the flow of air in your lungs. ? Allergy tests. ? Imaging tests, such as X-rays. How is this treated? There is no cure for this condition, but treatment can help control your symptoms. Treatment for asthma usually involves:  Identifying and avoiding your asthma triggers.  Using medicines to control your symptoms. Generally, two types of medicines are used to treat asthma: ? Controller medicines. These help prevent asthma symptoms from occurring. They are usually taken every day. ? Fast-acting reliever or rescue medicines. These quickly relieve asthma symptoms by widening the narrow and tight airways. They are used as needed and provide short-term relief.  Using supplemental oxygen. This may be needed during a severe episode.  Using other medicines, such as: ? Allergy medicines, such as antihistamines, if your asthma attacks are triggered by allergens. ? Immune medicines (immunomodulators). These are medicines that help control the immune system.  Creating an asthma action plan. An asthma action plan is a written plan for managing and treating your  asthma attacks. This plan includes: ? A list of  your asthma triggers and how to avoid them. ? Information about when medicines should be taken and when their dosage should be changed. ? Instructions about using a device called a peak flow meter. A peak flow meter measures how well the lungs are working and the severity of your asthma. It helps you monitor your condition. Follow these instructions at home: Controlling your home environment Control your home environment in the following ways to help avoid triggers and prevent asthma attacks:  Change your heating and air conditioning filter regularly.  Limit your use of fireplaces and wood stoves.  Get rid of pests (such as roaches and mice) and their droppings.  Throw away plants if you see mold on them.  Clean floors and dust surfaces regularly. Use unscented cleaning products.  Try to have someone else vacuum for you regularly. Stay out of rooms while they are being vacuumed and for a short while afterward. If you vacuum, use a dust mask from a hardware store, a double-layered or microfilter vacuum cleaner bag, or a vacuum cleaner with a HEPA filter.  Replace carpet with wood, tile, or vinyl flooring. Carpet can trap dander and dust.  Use allergy-proof pillows, mattress covers, and box spring covers.  Keep your bedroom a trigger-free room.  Avoid pets and keep windows closed when allergens are in the air.  Wash beddings every week in hot water and dry them in a dryer.  Use blankets that are made of polyester or cotton.  Clean bathrooms and kitchens with bleach. If possible, have someone repaint the walls in these rooms with mold-resistant paint. Stay out of the rooms that are being cleaned and painted.  Wash your hands often with soap and water. If soap and water are not available, use hand sanitizer.  Do not allow anyone to smoke in your home. General instructions  Take over-the-counter and prescription medicines only as told by your health care provider. ? Speak with your  health care provider if you have questions about how or when to take the medicines. ? Make note if you are requiring more frequent dosages.  Do not use any products that contain nicotine or tobacco, such as cigarettes and e-cigarettes. If you need help quitting, ask your health care provider. Also, avoid being exposed to secondhand smoke.  Use a peak flow meter as told by your health care provider. Record and keep track of the readings.  Understand and use the asthma action plan to help minimize, or stop an asthma attack, without needing to seek medical care.  Make sure you stay up to date on your yearly vaccinations as told by your health care provider. This may include vaccines for the flu and pneumonia.  Avoid outdoor activities when allergen counts are high and when air quality is low.  Wear a ski mask that covers your nose and mouth during outdoor winter activities. Exercise indoors on cold days if you can.  Warm up before exercising, and take time for a cool-down period after exercise.  Keep all follow-up visits as told by your health care provider. This is important. Where to find more information  For information about asthma, turn to the Centers for Disease Control and Prevention at http://www.mills-berg.com/  For air quality information, turn to [employer] at http://hood.com/ Contact a health care provider if:  You have wheezing, shortness of breath, or a cough even while you are taking medicine to prevent attacks.  The mucus you  cough up (sputum) is thicker than usual.  Your sputum changes from clear or white to yellow, green, gray, or bloody.  Your medicines are causing side effects, such as a rash, itching, swelling, or trouble breathing.  You need to use a reliever medicine more than 2-3 times a week.  Your peak flow reading is still at 50-79% of your personal best after following your action plan for 1 hour.  You have a fever. Get help right away if:  You are getting worse  and do not respond to treatment during an asthma attack.  You are short of breath when at rest or when doing very little physical activity.  You have difficulty eating, drinking, or talking.  You have chest pain or tightness.  You develop a fast heartbeat or palpitations.  You have a bluish color to your lips or fingernails.  You are light-headed or dizzy, or you faint.  Your peak flow reading is less than 50% of your personal best.  You feel too tired to breathe normally. Summary  Asthma is a long-term (chronic) condition that causes recurrent episodes in which the airways become tight and narrow. These episodes can cause coughing, wheezing, shortness of breath, and chest pain.  Asthma cannot be cured, but medicines and lifestyle changes can help control it and treat acute attacks.  Make sure you understand how to avoid triggers and how and when to use your medicines.  Asthma attacks can range from minor to life threatening. Get help right away if you have an asthma attack and do not respond to treatment with your usual rescue medicines. This information is not intended to replace advice given to you by your health care provider. Make sure you discuss any questions you have with your health care provider. Document Revised: 05/31/2020 Document Reviewed: 01/03/2020 Elsevier Patient Education  2021 [employer].

## 2020-11-19 NOTE — Assessment & Plan Note (Addendum)
-   Significantly improved on triple therapy. Requiring SABA once daily. ACT 14 (5)  - Continue Breztri two puffs q12 hours, Singulair 10mg  at bedtime and prn Albuterol 2 puffs q 6 hours  - FU in 6 months with PFTs

## 2020-11-27 NOTE — Progress Notes (Signed)
Thanks for seeing her in follow up   Deanna Igo, DO [facility] 11/27/2020 6:23 PM

## 2020-12-20 ENCOUNTER — Ambulatory Visit: Payer: BC Managed Care – PPO | Admitting: Orthopedic Surgery

## 2020-12-20 ENCOUNTER — Ambulatory Visit: Payer: BC Managed Care – PPO

## 2020-12-20 ENCOUNTER — Encounter: Payer: Self-pay | Admitting: Orthopedic Surgery

## 2020-12-20 ENCOUNTER — Other Ambulatory Visit: Payer: Self-pay

## 2020-12-20 VITALS — BP 141/78 | HR 81 | Ht 65.0 in | Wt 183.0 lb

## 2020-12-20 DIAGNOSIS — M25512 Pain in left shoulder: Secondary | ICD-10-CM

## 2020-12-20 NOTE — Patient Instructions (Addendum)
Instructions Following Joint Injections  In clinic today, you received an injection in one of your joints (sometimes more than one).  Occasionally, you can have some pain at the injection site, this is normal.  You can place ice at the injection site, or take over-the-counter medications such as Tylenol (acetaminophen) or Advil (ibuprofen).  Please follow all directions listed on the bottle.  If your joint (knee or shoulder) becomes swollen, red or very painful, please contact the clinic for additional assistance.   Two medications were injected, including lidocaine and a steroid (often referred to as cortisone).  Lidocaine is effective almost immediately but wears off quickly.  However, the steroid can take a few days to improve your symptoms.  In some cases, it can make your pain worse for a couple of days.  Do not be concerned if this happens as it is common.  You can apply ice or take some over-the-counter medications as needed.     Rotator Cuff Tear/Tendinitis Rehab   Ask your health care provider which exercises are safe for you. Do exercises exactly as told by your health care provider and adjust them as directed. It is normal to feel mild stretching, pulling, tightness, or discomfort as you do these exercises. Stop right away if you feel sudden pain or your pain gets worse. Do not begin these exercises until told by your health care provider. Stretching and range-of-motion exercises  These exercises warm up your muscles and joints and improve the movement and flexibility of your shoulder. These exercises also help to relieve pain.  Shoulder pendulum In this exercise, you let the injured arm dangle toward the floor and then swing it like a clock pendulum. 1. Stand near a table or counter that you can hold onto for balance. 2. Bend forward at the waist and let your left / right arm hang straight down. Use your other arm to support you and help you stay balanced. 3. Relax your left / right  arm and shoulder muscles, and move your hips and your trunk so your left / right arm swings freely. Your arm should swing because of the motion of your body, not because you are using your arm or shoulder muscles. 4. Keep moving your hips and trunk so your arm swings in the following directions, as told by your health care provider: ? Side to side. ? Forward and backward. ? In clockwise and counterclockwise circles. 5. Slowly return to the starting position. Repeat 10 times, or for 10 seconds per direction. Complete this exercise 2-3 times a day.      Shoulder flexion, seated This exercise is sometimes called table slides. In this exercise, you raise your arm in front of your body until you feel a stretch in your injured shoulder. 1. Sit in a stable chair so your left / right forearm can rest on a flat surface. Your elbow should rest at a height that keeps your upper arm next to your body. 2. Keeping your left / right shoulder relaxed, lean forward at the waist and let your hand slide forward (flexion). Stop when you feel a stretch in your shoulder, or when you reach the angle that is recommended by your health care provider. 3. Hold for 5 seconds. 4. Slowly return to the starting position. Repeat 10 times. Complete this exercise 1-2  times a day.       Shoulder flexion, standing In this exercise, you raise your arm in front of your body (flexion) until you feel   a stretch in your injured shoulder. 1. Stand and hold a broomstick, a cane, or a similar object. Place your hands a little more than shoulder-width apart on the object. Your left / right hand should be palm-up, and your other hand should be palm-down. 2. Keep your elbow straight and your shoulder muscles relaxed. Push the stick up with your healthy arm to raise your left / right arm in front of your body, and then over your head until you feel a stretch in your shoulder. ? Avoid shrugging your shoulder while you raise your arm. Keep your  shoulder blade tucked down toward the middle of your back. ? Keep your left / right shoulder muscles relaxed. 3. Hold for 10 seconds. 4. Slowly return to the starting position. Repeat 10 times. Complete this exercise 1-2 times a day.      Shoulder abduction, active-assisted You will need a stick, broom handle, or similar object to help you (assist) in doing this exercise. 1. Lie on your back. This is the supine position. Hold a broomstick, a cane, or a similar object. 2. Place your hands a little more than shoulder-width apart on the object. Your left / right hand should be palm-up, and your other hand should be palm-down. 3. Keeping your shoulder relaxed, push the stick to raise your left / right arm out to your side (abduction) and then over your head. Use your other hand to help move the stick. Stop when you feel a stretch in your shoulder, or when you reach the angle that is recommended by your health care provider. ? Avoid shrugging your shoulder while you raise your arm. Keep your shoulder blade tucked down toward the middle of your back. 4. Hold for 10 seconds. 5. Slowly return to the starting position. Repeat 10 times. Complete this exercise 1-2 times a day.      Shoulder flexion, active-assisted 1. Lie on your back. You may bend your knees for comfort. 2. Hold a broomstick, a cane, or a similar object so that your hands are about shoulder-width apart. Your palms should face toward your feet. 3. Raise your left / right arm over your head, then behind your head toward the floor (flexion). Use your other hand to help you do this (active-assisted). Stop when you feel a gentle stretch in your shoulder, or when you reach the angle that is recommended by your health care provider. 4. Hold for 10 seconds. 5. Use the stick and your other arm to help you return your left / right arm to the starting position. Repeat 10 times. Complete this exercise 1-2 times a day.      External  rotation 1. Sit in a stable chair without armrests, or stand up. 2. Tuck a soft object, such as a folded towel or a small ball, under your left / right upper arm. 3. Hold a broomstick, a cane, or a similar object with your palms face-down, toward the floor. Bend your elbows to a 90-degree angle (right angle), and keep your hands about shoulder-width apart. 4. Straighten your healthy arm and push the stick across your body, toward your left / right side. Keep your left / right arm bent. This will rotate your left / right forearm away from your body (external rotation). 5. Hold for 10 seconds. 6. Slowly return to the starting position. Repeat 10 times. Complete this exercise 1-2 times a day.        Strengthening exercises These exercises build strength and endurance in your   shoulder. Endurance is the ability to use your muscles for a long time, even after they get tired. Do not start doing these exercises until your health care provider approves. Shoulder flexion, isometric 1. Stand or sit in a doorway, facing the door frame. 2. Keep your left / right arm straight and make a gentle fist with your hand. Place your fist against the door frame. Only your fist should be touching the frame. Keep your upper arm at your side. 3. Gently press your fist against the door frame, as if you are trying to raise your arm above your head (isometric shoulder flexion). ? Avoid shrugging your shoulder while you press your hand into the door frame. Keep your shoulder blade tucked down toward the middle of your back. 4. Hold for 10 seconds. 5. Slowly release the tension, and relax your muscles completely before you repeat the exercise. Repeat 10 times. Complete this exercise 3 times per week.      Shoulder abduction, isometric 1. Stand or sit in a doorway. Your left / right arm should be closest to the door frame. 2. Keep your left / right arm straight, and place the back of your hand against the door frame. Only  your hand should be touching the frame. Keep the rest of your arm close to your side. 3. Gently press the back of your hand against the door frame, as if you are trying to raise your arm out to the side (isometric shoulder abduction). ? Avoid shrugging your shoulder while you press your hand into the door frame. Keep your shoulder blade tucked down toward the middle of your back. 4. Hold for 10 seconds. 5. Slowly release the tension, and relax your muscles completely before you repeat the exercise. Repeat 10 times. Complete this exercise 3 times per week.      Internal rotation, isometric This is an exercise in which you press your palm against a door frame without moving your shoulder joint (isometric). 1. Stand or sit in a doorway, facing the door frame. 2. Bend your left / right elbow, and place the palm of your hand against the door frame. Only your palm should be touching the frame. Keep your upper arm at your side. 3. Gently press your hand against the door frame, as if you are trying to push your arm toward your abdomen (internal rotation). Gradually increase the pressure until you are pressing as hard as you can. Stop increasing the pressure if you feel shoulder pain. ? Avoid shrugging your shoulder while you press your hand into the door frame. Keep your shoulder blade tucked down toward the middle of your back. 4. Hold for 10 seconds. 5. Slowly release the tension, and relax your muscles completely before you repeat the exercise. Repeat 10 times. Complete this exercise 3 times per week.      External rotation, isometric This is an exercise in which you press the back of your wrist against a door frame without moving your shoulder joint (isometric). 1. Stand or sit in a doorway, facing the door frame. 2. Bend your left / right elbow and place the back of your wrist against the door frame. Only the back of your wrist should be touching the frame. Keep your upper arm at your  side. 3. Gently press your wrist against the door frame, as if you are trying to push your arm away from your abdomen (external rotation). Gradually increase the pressure until you are pressing as hard as you can.   Stop increasing the pressure if you feel pain. ? Avoid shrugging your shoulder while you press your wrist into the door frame. Keep your shoulder blade tucked down toward the middle of your back. 4. Hold for 10 seconds. 5. Slowly release the tension, and relax your muscles completely before you repeat the exercise. Repeat 10 times. Complete this exercise 3 times per week.       Scapular retraction 1. Sit in a stable chair without armrests, or stand up. 2. Secure an exercise band to a stable object in front of you so the band is at shoulder height. 3. Hold one end of the exercise band in each hand. Your palms should face down. 4. Squeeze your shoulder blades together (retraction) and move your elbows slightly behind you. Do not shrug your shoulders upward while you do this. 5. Hold for 10 seconds. 6. Slowly return to the starting position. Repeat 10 times. Complete this exercise 3 times per week.      Shoulder extension 1. Sit in a stable chair without armrests, or stand up. 2. Secure an exercise band to a stable object in front of you so the band is above shoulder height. 3. Hold one end of the exercise band in each hand. 4. Straighten your elbows and lift your hands up to shoulder height. 5. Squeeze your shoulder blades together and pull your hands down to the sides of your thighs (extension). Stop when your hands are straight down by your sides. Do not let your hands go behind your body. 6. Hold for 10 seconds. 7. Slowly return to the starting position. Repeat 10 times. Complete this exercise 3 times per week.       Scapular protraction, supine 1. Lie on your back on a firm surface (supine position). Hold a 5 lbs (or soup can) weight in your left / right hand. 2. Raise  your left / right arm straight into the air so your hand is directly above your shoulder joint. 3. Push the weight into the air so your shoulder (scapula) lifts off the surface that you are lying on. The scapula will push up or forward (protraction). Do not move your head, neck, or back. 4. Hold for 10 seconds. 5. Slowly return to the starting position. Let your muscles relax completely before you repeat this exercise.  Repeat 10 times. Complete this exercise 3 times per week.          

## 2020-12-20 NOTE — Progress Notes (Signed)
New Patient Visit  Assessment: [name] is a 50 y.o. female with the following: Left shoulder adhesive capsulitis   Plan: We had extensive discussion with [name] regarding her left shoulder.  Based on her presentation, and limited range of motion on physical exam today, she has left shoulder adhesive capsulitis.  This condition was discussed with her in great detail.  All questions were answered.  She is aware that this can take up to 2 years for full resolution of her symptoms.  I recommended a left shoulder steroid injection, and she is elected to proceed.  I have also provided her with a home exercise program.  If she has difficulty with range of motion, she will contact the clinic for a formal referral to physical therapy.  Continue with Tylenol as needed.  If she continues to struggle with her range of motion and pain, we could ultimately refer her to one of my colleagues for an ultrasound-guided injection.  All questions were answered she is amenable this plan.   Procedure note injection Left shoulder    Verbal consent was obtained to inject the left shoulder, subacromial space Timeout was completed to confirm the site of injection.  The skin was prepped with alcohol and ethyl chloride was sprayed at the injection site.  A 21-gauge needle was used to inject  6 mg of betamethasone and 1% lidocaine (3 cc) into the subacromial space of the left shoulder using a posterolateral approach.  There were no complications. A sterile bandage was applied.    Follow-up: Return if symptoms worsen or fail to improve.  Subjective:  Chief Complaint  Patient presents with  . Shoulder Pain    Lt shoulder pain for 2 months thinks it could be related to a fall she had last year.     History of Present Illness: [name] is a 50 y.o. RHD female who presents for evaluation of left shoulder pain.  She states that approximately 5-6 months ago, and she is looking at the trash when she  slipped and fell.  She landed on her left side.  She did not note significant pain in her left shoulder at that time.  However, in the subsequent days, she started develop pain and stiffness.  She was evaluated at an urgent care, and given some prednisone.  This improved her pain for short period of time, but did not completely resolve it.  Since then, she continues to have significant decreases in her range of motion.  She is also having some pain in her left shoulder.  She has difficulty sleeping on her left side.  She is unable to use her left hand to do her hair.  She is taking Tylenol for pain, but is not helping.  She is not had any injections.  She is unable to do any physical therapy or exercise for her left shoulder.  She is able to tolerate activities below the level of her shoulder.   Review of Systems: No fevers or chills No numbness or tingling No chest pain No shortness of breath No bowel or bladder dysfunction No GI distress No headaches   Medical History:  Past Medical History:  Diagnosis Date  . Asthma   . Diabetes mellitus without complication (HCC)     History reviewed. No pertinent surgical history.  History reviewed. No pertinent family history. Social History   Tobacco Use  . Smoking status: Never Smoker  . Smokeless tobacco: Never Used  Substance Use Topics  .  Alcohol use: No  . Drug use: No    No Known Allergies  Current Meds  Medication Sig  . albuterol (PROVENTIL) (2.5 MG/3ML) 0.083% nebulizer solution Take 3 mLs (2.5 mg total) by nebulization every 6 (six) hours as needed for wheezing or shortness of breath.  [name] albuterol (VENTOLIN HFA) 108 (90 Base) MCG/ACT inhaler Inhale 2 puffs into the lungs every 6 (six) hours as needed.  . Ascorbic Acid (VITAMIN C PO) Take by mouth daily.  . Budeson-Glycopyrrol-Formoterol (BREZTRI AEROSPHERE) 160-9-4.8 MCG/ACT AERO Inhale 2 puffs into the lungs in the morning and at bedtime.  [name] CALCIUM-VITAMIN D PO Take 1  capsule by mouth daily.  [name] gabapentin (NEURONTIN) 300 MG capsule Take by mouth.  . hydrochlorothiazide (HYDRODIURIL) 25 MG tablet Take 1 tablet (25 mg total) by mouth daily.  [name] JARDIANCE 10 MG TABS tablet Take 10 mg by mouth daily.  [name] lisinopril (ZESTRIL) 10 MG tablet Take 1 tablet by mouth daily.  . metFORMIN (GLUCOPHAGE) 500 MG tablet Take 1 tablet (500 mg total) by mouth daily with breakfast.  . montelukast (SINGULAIR) 10 MG tablet Take 1 tablet (10 mg total) by mouth at bedtime.  . Multiple Vitamin (MULTIVITAMIN) tablet Take 1 tablet by mouth daily.  [name] omega-3 acid ethyl esters (LOVAZA) 1 g capsule Take by mouth 2 (two) times daily.  . rosuvastatin (CRESTOR) 10 MG tablet Take 10 mg by mouth at bedtime.    Objective: BP (!) 141/78   Pulse 81   Ht 5\' 5"  (1.651 m)   Wt 183 lb (83 kg)   BMI 30.45 kg/m   Physical Exam:  General: Alert and oriented.  No acute distress. Gait: Normal  Evaluation of left shoulder demonstrates no effusion.  There is no obvious deformity.  No atrophy is appreciated.  Mild prominence at the [facility] joint, with minimal tenderness to palpation.  Forward elevation limited to approximately 100 degrees with significant discomfort.  Internal rotation to her back pocket.  External rotation at her side to less than 25 degrees, compared to 45 degrees on the contralateral side.  She is able to achieve the empty can testing position, but this is painful for her.  Negative belly press.  4+/5 infraspinatus testing, with some discomfort noted.  Fingers are warm and well perfused.  2+ radial pulse.   IMAGING: I personally ordered and reviewed the following images   X-ray of the left shoulder obtained in clinic today and demonstrates well reduced left glenohumeral joint.  Minimal degenerative changes noted within the glenohumeral joint.  Some narrowing and superior osteophyte formation at the [facility] joint.  No acute injuries are noted.  Impression: Mild left shoulder x-ray, with  minimal degenerative changes.   New Medications:  No orders of the defined types were placed in this encounter.     [facility], MD  12/20/2020 9:35 AM

## 2020-12-22 ENCOUNTER — Other Ambulatory Visit: Payer: Self-pay | Admitting: *Deleted

## 2020-12-22 MED ORDER — LEVALBUTEROL TARTRATE 45 MCG/ACT IN AERO: 1.0000 | INHALATION_SPRAY | RESPIRATORY_TRACT | 5 refills | Status: AC | PRN

## 2021-08-04 ENCOUNTER — Encounter: Payer: Self-pay | Admitting: Pulmonary Disease

## 2021-08-04 ENCOUNTER — Ambulatory Visit (INDEPENDENT_AMBULATORY_CARE_PROVIDER_SITE_OTHER): Payer: BC Managed Care – PPO | Admitting: Pulmonary Disease

## 2021-08-04 ENCOUNTER — Other Ambulatory Visit: Payer: Self-pay

## 2021-08-04 VITALS — BP 124/78 | HR 86 | Temp 98.2°F | Ht 68.0 in | Wt 174.0 lb

## 2021-08-04 DIAGNOSIS — J4550 Severe persistent asthma, uncomplicated: Secondary | ICD-10-CM

## 2021-08-04 DIAGNOSIS — R052 Subacute cough: Secondary | ICD-10-CM

## 2021-08-04 DIAGNOSIS — R062 Wheezing: Secondary | ICD-10-CM

## 2021-08-04 LAB — PULMONARY FUNCTION TEST
DL/VA % pred: 139 %
DL/VA: 5.95 ml/min/mmHg/L
DLCO cor % pred: 118 %
DLCO cor: 25.72 ml/min/mmHg
DLCO unc % pred: 118 %
DLCO unc: 25.72 ml/min/mmHg
FEF 25-75 Post: 2.05 L/sec
FEF 25-75 Pre: 1.73 L/sec
FEF2575-%Change-Post: 18 %
FEF2575-%Pred-Post: 82 %
FEF2575-%Pred-Pre: 69 %
FEV1-%Change-Post: 5 %
FEV1-%Pred-Post: 92 %
FEV1-%Pred-Pre: 87 %
FEV1-Post: 2.22 L
FEV1-Pre: 2.1 L
FEV1FVC-%Change-Post: 3 %
FEV1FVC-%Pred-Pre: 92 %
FEV6-%Change-Post: 2 %
FEV6-%Pred-Post: 97 %
FEV6-%Pred-Pre: 95 %
FEV6-Post: 2.84 L
FEV6-Pre: 2.76 L
FEV6FVC-%Pred-Post: 102 %
FEV6FVC-%Pred-Pre: 102 %
FVC-%Change-Post: 1 %
FVC-%Pred-Post: 94 %
FVC-%Pred-Pre: 93 %
FVC-Post: 2.84 L
FVC-Pre: 2.79 L
Post FEV1/FVC ratio: 78 %
Post FEV6/FVC ratio: 100 %
Pre FEV1/FVC ratio: 75 %
Pre FEV6/FVC Ratio: 100 %
RV % pred: 96 %
RV: 1.79 L
TLC % pred: 85 %
TLC: 4.47 L

## 2021-08-04 NOTE — Progress Notes (Signed)
PFT done today. 

## 2021-08-04 NOTE — Patient Instructions (Addendum)
Thank you for visiting Dr. Tonia Brooms at [facility]. Today we recommend the following:  Continue your breztri inhaler  Continue albuterol inhaler  Use nebulizer before you go to bed.   Return in about 1 year (around 08/04/2022) for with APP or Dr. Tonia Brooms.    Please do your part to reduce the spread of COVID-19.

## 2021-08-04 NOTE — Progress Notes (Signed)
Synopsis: Referred in January 2022 for moderate persistent asthma by Scifres, Nicole Cella, PA-C  Subjective:   PATIENT ID: [name] GENDER: female DOB: 1970/12/02, MRN: [id number]  Chief Complaint  Patient presents with   Follow-up    Follow up on PFT    This is a 50 year old female, past medical history of asthma, diabetes referred for evaluation for moderate persistent asthma symptoms.  No prior PFTs on file.  PA lateral chest x-ray in March 2019 with no infiltrate or effusions no active disease.  Report reviewed in epic care everywhere as was completed at [facility]. She has had asthma dx since she was a kid. She was on inhalers, she uses her albuterol 10 times per day. Not currently on any maintence daily inhaler. Symptoms are worse at night. She has seasonal allergies.  She has never been hospitalized.  She has had a persistent cough for the past year.  She is on lisinopril for blood pressure management but she also has uncontrolled asthma symptoms.  Patient had blood work on 08/14/2020 at primary care office visit which reveals a eosinophil count of 500, 8.3% eos.  OV 08/04/2021: Here today for asthma follow-up.  Recent PFTs completed prior to office visit with normal ratio, normal FEV1 within normal limits.  Normal TLC.  DLCO 118% predicted.  She has been doing well except her nocturnal cough returned.  This past summer in July she had COVID-19 which lasted for several weeks.  Cough is slowly improving.  She still predominantly has nocturnal symptoms.  Of note she recently had her son's dog now living with her.  She is not really sure if this is called her cough symptoms to flareup.   Past Medical History:  Diagnosis Date   Asthma    Diabetes mellitus without complication (HCC)      No family history on file.   No past surgical history on file.  Social History   Socioeconomic History   Marital status: Married    Spouse name: Not on file   Number of children: Not on  file   Years of education: Not on file   Highest education level: Not on file  Occupational History   Not on file  Tobacco Use   Smoking status: Never   Smokeless tobacco: Never  Substance and Sexual Activity   Alcohol use: No   Drug use: No   Sexual activity: Not on file  Other Topics Concern   Not on file  Social History Narrative   Not on file   Social Determinants of Health   Financial Resource Strain: Not on file  Food Insecurity: Not on file  Transportation Needs: Not on file  Physical Activity: Not on file  Stress: Not on file  Social Connections: Not on file  Intimate Partner Violence: Not on file     No Known Allergies   Outpatient Medications Prior to Visit  Medication Sig Dispense Refill   albuterol (PROVENTIL) (2.5 MG/3ML) 0.083% nebulizer solution Take 3 mLs (2.5 mg total) by nebulization every 6 (six) hours as needed for wheezing or shortness of breath. 75 mL 12   Ascorbic Acid (VITAMIN C PO) Take by mouth daily.     Budeson-Glycopyrrol-Formoterol (BREZTRI AEROSPHERE) 160-9-4.8 MCG/ACT AERO Inhale 2 puffs into the lungs in the morning and at bedtime. 10.7 g 11   CALCIUM-VITAMIN D PO Take 1 capsule by mouth daily.     gabapentin (NEURONTIN) 300 MG capsule Take by mouth.     hydrochlorothiazide (HYDRODIURIL)  25 MG tablet Take 1 tablet (25 mg total) by mouth daily. 7 tablet 0   JARDIANCE 10 MG TABS tablet Take 10 mg by mouth daily.     levalbuterol (XOPENEX HFA) 45 MCG/ACT inhaler Inhale 1-2 puffs into the lungs every 4 (four) hours as needed for wheezing. 1 each 5   lisinopril (ZESTRIL) 10 MG tablet Take 1 tablet by mouth daily.     metFORMIN (GLUCOPHAGE) 500 MG tablet Take 1 tablet (500 mg total) by mouth daily with breakfast. 7 tablet 0   montelukast (SINGULAIR) 10 MG tablet Take 1 tablet (10 mg total) by mouth at bedtime. 30 tablet 11   Multiple Vitamin (MULTIVITAMIN) tablet Take 1 tablet by mouth daily.     omega-3 acid ethyl esters (LOVAZA) 1 g capsule  Take by mouth 2 (two) times daily.     rosuvastatin (CRESTOR) 10 MG tablet Take 10 mg by mouth at bedtime.     No facility-administered medications prior to visit.    Review of Systems  Constitutional:  Negative for chills, fever, malaise/fatigue and weight loss.  HENT:  Negative for hearing loss, sore throat and tinnitus.   Eyes:  Negative for blurred vision and double vision.  Respiratory:  Positive for cough. Negative for hemoptysis, sputum production, shortness of breath, wheezing and stridor.   Cardiovascular:  Negative for chest pain, palpitations, orthopnea, leg swelling and PND.  Gastrointestinal:  Negative for abdominal pain, constipation, diarrhea, heartburn, nausea and vomiting.  Genitourinary:  Negative for dysuria, hematuria and urgency.  Musculoskeletal:  Negative for joint pain and myalgias.  Skin:  Negative for itching and rash.  Neurological:  Negative for dizziness, tingling, weakness and headaches.  Endo/Heme/Allergies:  Negative for environmental allergies. Does not bruise/bleed easily.  Psychiatric/Behavioral:  Negative for depression. The patient is not nervous/anxious and does not have insomnia.   All other systems reviewed and are negative.   Objective:  Physical Exam Vitals reviewed.  Constitutional:      General: She is not in acute distress.    Appearance: She is well-developed.  HENT:     Head: Normocephalic and atraumatic.  Eyes:     General: No scleral icterus.    Conjunctiva/sclera: Conjunctivae normal.     Pupils: Pupils are equal, round, and reactive to light.  Neck:     Vascular: No JVD.     Trachea: No tracheal deviation.  Cardiovascular:     Rate and Rhythm: Normal rate and regular rhythm.     Heart sounds: Normal heart sounds. No murmur heard. Pulmonary:     Effort: Pulmonary effort is normal. No tachypnea, accessory muscle usage or respiratory distress.     Breath sounds: Normal breath sounds. No stridor. No wheezing, rhonchi or rales.   Abdominal:     General: Bowel sounds are normal. There is no distension.     Palpations: Abdomen is soft.     Tenderness: There is no abdominal tenderness.  Musculoskeletal:        General: No tenderness.     Cervical back: Neck supple.  Lymphadenopathy:     Cervical: No cervical adenopathy.  Skin:    General: Skin is warm and dry.     Capillary Refill: Capillary refill takes less than 2 seconds.     Findings: No rash.  Neurological:     Mental Status: She is alert and oriented to person, place, and time.  Psychiatric:        Behavior: Behavior normal.     Vitals:  08/04/21 1510  BP: 124/78  Pulse: 86  Temp: 98.2 F (36.8 C)  TempSrc: Oral  SpO2: 97%  Weight: 174 lb (78.9 kg)  Height: 5\' 8"  (1.727 m)   97% on RA BMI Readings from Last 3 Encounters:  08/04/21 26.46 kg/m  12/20/20 30.45 kg/m  11/19/20 31.32 kg/m   Wt Readings from Last 3 Encounters:  08/04/21 174 lb (78.9 kg)  12/20/20 183 lb (83 kg)  11/19/20 188 lb 3.2 oz (85.4 kg)     CBC    Component Value Date/Time   WBC 7.6 10/21/2015 1741   RBC 5.04 10/21/2015 1741   HGB 14.0 10/21/2015 1741   HCT 43.4 10/21/2015 1741   PLT 374 10/21/2015 1741   MCV 86.1 10/21/2015 1741   MCH 27.8 10/21/2015 1741   MCHC 32.3 10/21/2015 1741   RDW 13.2 10/21/2015 1741   LYMPHSABS 1.8 09/08/2008 2109   MONOABS 0.5 09/08/2008 2109   EOSABS 0.2 09/08/2008 2109   BASOSABS 0.0 09/08/2008 2109    Chest Imaging: No recent chest imaging   Pulmonary Functions Testing Results: PFT Results Latest Ref Rng & Units 08/04/2021  FVC-Pre L 2.79  FVC-Predicted Pre % 93  FVC-Post L 2.84  FVC-Predicted Post % 94  Pre FEV1/FVC % % 75  Post FEV1/FCV % % 78  FEV1-Pre L 2.10  FEV1-Predicted Pre % 87  FEV1-Post L 2.22  DLCO uncorrected ml/min/mmHg 25.72  DLCO UNC% % 118  DLCO corrected ml/min/mmHg 25.72  DLCO COR %Predicted % 118  DLVA Predicted % 139  TLC L 4.47  TLC % Predicted % 85  RV % Predicted % 96     FeNO:   Pathology:   Echocardiogram:   Heart Catheterization:     Assessment & Plan:     ICD-10-CM   1. Severe persistent asthma without complication  123XX123     2. Wheezing  R06.2     3. Subacute cough  R05.2       Discussion: This is a 50 year old female, longstanding history of asthma since childhood, non-smoker.  She presents initially with ongoing cough shortness of breath and wheezing.  She was started on a new triple therapy inhaler regimen.  She had previous blood counts with eosinophils as greatest as 500 in 2009.  No other recent lab work.  Reviewed and interpreted pulmonary function test today in the office today elevated DLCO 118%.  Plan: Continue Breztri 2 puffs twice daily Continue albuterol as needed. Continue Singulair Continue albuterol as needed Continue to use of nebulizer, discussed using that prior to bedtime. If she has ongoing breakthrough symptoms I think neck step would be consideration for IgE, RAST testing and consideration for biologic use if needed. Would consider having her see Dr. Loanne Drilling next if her symptoms get worse for discussion of biologic addition..    Current Outpatient Medications:    albuterol (PROVENTIL) (2.5 MG/3ML) 0.083% nebulizer solution, Take 3 mLs (2.5 mg total) by nebulization every 6 (six) hours as needed for wheezing or shortness of breath., Disp: 75 mL, Rfl: 12   Ascorbic Acid (VITAMIN C PO), Take by mouth daily., Disp: , Rfl:    Budeson-Glycopyrrol-Formoterol (BREZTRI AEROSPHERE) 160-9-4.8 MCG/ACT AERO, Inhale 2 puffs into the lungs in the morning and at bedtime., Disp: 10.7 g, Rfl: 11   CALCIUM-VITAMIN D PO, Take 1 capsule by mouth daily., Disp: , Rfl:    gabapentin (NEURONTIN) 300 MG capsule, Take by mouth., Disp: , Rfl:    hydrochlorothiazide (HYDRODIURIL) 25 MG tablet, Take  1 tablet (25 mg total) by mouth daily., Disp: 7 tablet, Rfl: 0   JARDIANCE 10 MG TABS tablet, Take 10 mg by mouth daily., Disp: , Rfl:     levalbuterol (XOPENEX HFA) 45 MCG/ACT inhaler, Inhale 1-2 puffs into the lungs every 4 (four) hours as needed for wheezing., Disp: 1 each, Rfl: 5   lisinopril (ZESTRIL) 10 MG tablet, Take 1 tablet by mouth daily., Disp: , Rfl:    metFORMIN (GLUCOPHAGE) 500 MG tablet, Take 1 tablet (500 mg total) by mouth daily with breakfast., Disp: 7 tablet, Rfl: 0   montelukast (SINGULAIR) 10 MG tablet, Take 1 tablet (10 mg total) by mouth at bedtime., Disp: 30 tablet, Rfl: 11   Multiple Vitamin (MULTIVITAMIN) tablet, Take 1 tablet by mouth daily., Disp: , Rfl:    omega-3 acid ethyl esters (LOVAZA) 1 g capsule, Take by mouth 2 (two) times daily., Disp: , Rfl:    rosuvastatin (CRESTOR) 10 MG tablet, Take 10 mg by mouth at bedtime., Disp: , Rfl:    Garner Nash, DO [facility] 08/04/2021 3:27 PM

## 2021-10-06 DIAGNOSIS — E785 Hyperlipidemia, unspecified: Secondary | ICD-10-CM | POA: Diagnosis not present

## 2021-10-06 DIAGNOSIS — E119 Type 2 diabetes mellitus without complications: Secondary | ICD-10-CM | POA: Diagnosis not present

## 2021-10-06 DIAGNOSIS — I10 Essential (primary) hypertension: Secondary | ICD-10-CM | POA: Diagnosis not present

## 2021-10-06 DIAGNOSIS — E1140 Type 2 diabetes mellitus with diabetic neuropathy, unspecified: Secondary | ICD-10-CM | POA: Diagnosis not present

## 2021-10-06 DIAGNOSIS — N951 Menopausal and female climacteric states: Secondary | ICD-10-CM | POA: Diagnosis not present

## 2021-10-06 DIAGNOSIS — D509 Iron deficiency anemia, unspecified: Secondary | ICD-10-CM | POA: Diagnosis not present

## 2021-10-31 DIAGNOSIS — Z719 Counseling, unspecified: Secondary | ICD-10-CM | POA: Diagnosis not present

## 2021-12-10 DIAGNOSIS — J069 Acute upper respiratory infection, unspecified: Secondary | ICD-10-CM | POA: Diagnosis not present

## 2021-12-10 DIAGNOSIS — R059 Cough, unspecified: Secondary | ICD-10-CM | POA: Diagnosis not present

## 2021-12-10 DIAGNOSIS — R6883 Chills (without fever): Secondary | ICD-10-CM | POA: Diagnosis not present

## 2022-01-29 DIAGNOSIS — R0789 Other chest pain: Secondary | ICD-10-CM | POA: Diagnosis not present

## 2022-01-29 DIAGNOSIS — E119 Type 2 diabetes mellitus without complications: Secondary | ICD-10-CM | POA: Diagnosis not present

## 2022-01-29 DIAGNOSIS — Z6829 Body mass index (BMI) 29.0-29.9, adult: Secondary | ICD-10-CM | POA: Diagnosis not present

## 2022-01-29 DIAGNOSIS — J45909 Unspecified asthma, uncomplicated: Secondary | ICD-10-CM | POA: Diagnosis not present

## 2022-01-29 DIAGNOSIS — R030 Elevated blood-pressure reading, without diagnosis of hypertension: Secondary | ICD-10-CM | POA: Diagnosis not present

## 2022-02-06 DIAGNOSIS — E119 Type 2 diabetes mellitus without complications: Secondary | ICD-10-CM | POA: Diagnosis not present

## 2022-03-13 DIAGNOSIS — J019 Acute sinusitis, unspecified: Secondary | ICD-10-CM | POA: Diagnosis not present

## 2022-03-13 DIAGNOSIS — E785 Hyperlipidemia, unspecified: Secondary | ICD-10-CM | POA: Diagnosis not present

## 2022-03-13 DIAGNOSIS — J4540 Moderate persistent asthma, uncomplicated: Secondary | ICD-10-CM | POA: Diagnosis not present

## 2022-03-13 DIAGNOSIS — E1142 Type 2 diabetes mellitus with diabetic polyneuropathy: Secondary | ICD-10-CM | POA: Diagnosis not present

## 2022-03-13 DIAGNOSIS — Z0000 Encounter for general adult medical examination without abnormal findings: Secondary | ICD-10-CM | POA: Diagnosis not present

## 2022-03-13 DIAGNOSIS — E119 Type 2 diabetes mellitus without complications: Secondary | ICD-10-CM | POA: Diagnosis not present

## 2022-03-13 DIAGNOSIS — Z13220 Encounter for screening for lipoid disorders: Secondary | ICD-10-CM | POA: Diagnosis not present

## 2022-03-16 ENCOUNTER — Other Ambulatory Visit: Payer: Self-pay | Admitting: Physician Assistant

## 2022-03-16 DIAGNOSIS — R0989 Other specified symptoms and signs involving the circulatory and respiratory systems: Secondary | ICD-10-CM

## 2022-05-24 ENCOUNTER — Other Ambulatory Visit: Payer: Self-pay | Admitting: Pulmonary Disease

## 2022-06-11 ENCOUNTER — Other Ambulatory Visit: Payer: Self-pay | Admitting: Adult Medicine

## 2022-06-11 DIAGNOSIS — Z1231 Encounter for screening mammogram for malignant neoplasm of breast: Secondary | ICD-10-CM

## 2022-06-15 ENCOUNTER — Ambulatory Visit
Admission: RE | Admit: 2022-06-15 | Discharge: 2022-06-15 | Disposition: A | Discharge status: Home / Self Care | Payer: BC Managed Care – PPO | Source: Ambulatory Visit | Attending: Adult Medicine | Admitting: Adult Medicine

## 2022-06-15 DIAGNOSIS — Z1231 Encounter for screening mammogram for malignant neoplasm of breast: Secondary | ICD-10-CM | POA: Diagnosis not present

## 2022-06-17 ENCOUNTER — Other Ambulatory Visit: Payer: Self-pay | Admitting: Physician Assistant

## 2022-06-17 ENCOUNTER — Other Ambulatory Visit: Payer: Self-pay | Admitting: Adult Medicine

## 2022-06-17 DIAGNOSIS — R928 Other abnormal and inconclusive findings on diagnostic imaging of breast: Secondary | ICD-10-CM

## 2022-06-27 ENCOUNTER — Ambulatory Visit
Admission: RE | Admit: 2022-06-27 | Discharge: 2022-06-27 | Disposition: A | Discharge status: Home / Self Care | Payer: BC Managed Care – PPO | Source: Ambulatory Visit | Attending: Adult Medicine | Admitting: Adult Medicine

## 2022-06-27 ENCOUNTER — Other Ambulatory Visit: Payer: Self-pay | Admitting: Physician Assistant

## 2022-06-27 DIAGNOSIS — N6310 Unspecified lump in the right breast, unspecified quadrant: Secondary | ICD-10-CM

## 2022-06-27 DIAGNOSIS — R928 Other abnormal and inconclusive findings on diagnostic imaging of breast: Secondary | ICD-10-CM | POA: Diagnosis not present

## 2022-06-27 DIAGNOSIS — N6311 Unspecified lump in the right breast, upper outer quadrant: Secondary | ICD-10-CM | POA: Diagnosis not present

## 2022-06-27 DIAGNOSIS — N6313 Unspecified lump in the right breast, lower outer quadrant: Secondary | ICD-10-CM | POA: Diagnosis not present

## 2022-09-02 ENCOUNTER — Other Ambulatory Visit: Payer: Self-pay | Admitting: Physician Assistant

## 2022-09-02 DIAGNOSIS — Z23 Encounter for immunization: Secondary | ICD-10-CM | POA: Diagnosis not present

## 2022-09-02 DIAGNOSIS — R0989 Other specified symptoms and signs involving the circulatory and respiratory systems: Secondary | ICD-10-CM | POA: Diagnosis not present

## 2022-09-02 DIAGNOSIS — E1142 Type 2 diabetes mellitus with diabetic polyneuropathy: Secondary | ICD-10-CM | POA: Diagnosis not present

## 2022-09-02 DIAGNOSIS — R7309 Other abnormal glucose: Secondary | ICD-10-CM | POA: Diagnosis not present

## 2022-09-02 DIAGNOSIS — E785 Hyperlipidemia, unspecified: Secondary | ICD-10-CM | POA: Diagnosis not present

## 2022-09-02 DIAGNOSIS — J4540 Moderate persistent asthma, uncomplicated: Secondary | ICD-10-CM | POA: Diagnosis not present

## 2022-09-13 DIAGNOSIS — R0602 Shortness of breath: Secondary | ICD-10-CM | POA: Diagnosis not present

## 2022-09-13 DIAGNOSIS — M791 Myalgia, unspecified site: Secondary | ICD-10-CM | POA: Diagnosis not present

## 2022-09-13 DIAGNOSIS — I10 Essential (primary) hypertension: Secondary | ICD-10-CM | POA: Diagnosis not present

## 2022-09-13 DIAGNOSIS — T732XXA Exhaustion due to exposure, initial encounter: Secondary | ICD-10-CM | POA: Diagnosis not present

## 2022-09-29 ENCOUNTER — Ambulatory Visit
Admission: RE | Admit: 2022-09-29 | Discharge: 2022-09-29 | Disposition: A | Discharge status: Home / Self Care | Payer: BC Managed Care – PPO | Source: Ambulatory Visit | Attending: Physician Assistant | Admitting: Physician Assistant

## 2022-09-29 DIAGNOSIS — R0989 Other specified symptoms and signs involving the circulatory and respiratory systems: Secondary | ICD-10-CM | POA: Diagnosis not present

## 2022-09-29 DIAGNOSIS — E119 Type 2 diabetes mellitus without complications: Secondary | ICD-10-CM | POA: Diagnosis not present

## 2022-09-29 DIAGNOSIS — I10 Essential (primary) hypertension: Secondary | ICD-10-CM | POA: Diagnosis not present

## 2022-09-29 DIAGNOSIS — E782 Mixed hyperlipidemia: Secondary | ICD-10-CM | POA: Diagnosis not present

## 2022-10-27 ENCOUNTER — Other Ambulatory Visit: Payer: Self-pay

## 2022-10-27 DIAGNOSIS — Z1211 Encounter for screening for malignant neoplasm of colon: Secondary | ICD-10-CM

## 2022-10-30 ENCOUNTER — Other Ambulatory Visit: Payer: Self-pay | Admitting: *Deleted

## 2022-10-30 DIAGNOSIS — Z1211 Encounter for screening for malignant neoplasm of colon: Secondary | ICD-10-CM

## 2022-10-30 NOTE — Progress Notes (Signed)
Patients last physical exam was completed in July 2022. Patient stated he has not had a rectal exam or colorectal cancer screening completed. Per patient has no colorectal cancer symptoms. Patient has no family history of colorectal cancer, inflammatory bowel disease, or colorectal polyps. FIT Test given to patient to complete.

## 2022-11-12 ENCOUNTER — Encounter: Payer: Self-pay | Admitting: Radiology

## 2022-12-04 DIAGNOSIS — D649 Anemia, unspecified: Secondary | ICD-10-CM | POA: Diagnosis not present

## 2022-12-04 DIAGNOSIS — K219 Gastro-esophageal reflux disease without esophagitis: Secondary | ICD-10-CM | POA: Diagnosis not present

## 2022-12-04 DIAGNOSIS — E1140 Type 2 diabetes mellitus with diabetic neuropathy, unspecified: Secondary | ICD-10-CM | POA: Diagnosis not present

## 2022-12-04 DIAGNOSIS — E785 Hyperlipidemia, unspecified: Secondary | ICD-10-CM | POA: Diagnosis not present

## 2022-12-04 DIAGNOSIS — E1142 Type 2 diabetes mellitus with diabetic polyneuropathy: Secondary | ICD-10-CM | POA: Diagnosis not present

## 2022-12-04 DIAGNOSIS — I10 Essential (primary) hypertension: Secondary | ICD-10-CM | POA: Diagnosis not present

## 2022-12-28 ENCOUNTER — Other Ambulatory Visit: Payer: BC Managed Care – PPO

## 2023-01-01 DIAGNOSIS — M5451 Vertebrogenic low back pain: Secondary | ICD-10-CM | POA: Diagnosis not present

## 2023-01-04 ENCOUNTER — Other Ambulatory Visit: Payer: Self-pay | Admitting: Physician Assistant

## 2023-01-04 ENCOUNTER — Ambulatory Visit
Admission: RE | Admit: 2023-01-04 | Discharge: 2023-01-04 | Disposition: A | Discharge status: Home / Self Care | Payer: BC Managed Care – PPO | Source: Ambulatory Visit | Attending: Physician Assistant | Admitting: Physician Assistant

## 2023-01-04 DIAGNOSIS — N6313 Unspecified lump in the right breast, lower outer quadrant: Secondary | ICD-10-CM | POA: Diagnosis not present

## 2023-01-04 DIAGNOSIS — N6310 Unspecified lump in the right breast, unspecified quadrant: Secondary | ICD-10-CM

## 2023-01-04 DIAGNOSIS — N6311 Unspecified lump in the right breast, upper outer quadrant: Secondary | ICD-10-CM | POA: Diagnosis not present

## 2023-01-12 ENCOUNTER — Other Ambulatory Visit: Payer: Self-pay | Admitting: Pulmonary Disease

## 2023-03-09 ENCOUNTER — Encounter: Payer: Self-pay | Admitting: Diagnostic Neuroimaging

## 2023-03-09 ENCOUNTER — Ambulatory Visit (INDEPENDENT_AMBULATORY_CARE_PROVIDER_SITE_OTHER): Payer: BC Managed Care – PPO | Admitting: Diagnostic Neuroimaging

## 2023-03-09 VITALS — BP 146/84 | HR 72 | Ht 65.0 in | Wt 155.0 lb

## 2023-03-09 DIAGNOSIS — R29898 Other symptoms and signs involving the musculoskeletal system: Secondary | ICD-10-CM | POA: Diagnosis not present

## 2023-03-09 DIAGNOSIS — R292 Abnormal reflex: Secondary | ICD-10-CM

## 2023-03-09 DIAGNOSIS — M48062 Spinal stenosis, lumbar region with neurogenic claudication: Secondary | ICD-10-CM | POA: Diagnosis not present

## 2023-03-09 DIAGNOSIS — R202 Paresthesia of skin: Secondary | ICD-10-CM

## 2023-03-09 DIAGNOSIS — R200 Anesthesia of skin: Secondary | ICD-10-CM | POA: Diagnosis not present

## 2023-03-09 MED ORDER — ALPRAZOLAM 0.5 MG PO TABS: ORAL_TABLET | ORAL | 0 refills | Status: AC

## 2023-03-09 NOTE — Patient Instructions (Signed)
BURNING / NUMBNESS IN FEET (diabetic neuropathy since ~2019 with A1c > 15; now A1c 7.3) - continue DM control - continue duloxetine; may increase to 60mg  daily - consider pregabalin 75-150mg  twice a day - consider capsaicin cream, lidocaine patch / cream, alpha-lipoic acid 600mg  daily  LOW BACK PAIN / BILATERAL LEG WEAKNESS / PAIN - check MRI lumbar spine (eval for lumbar spinal stenosis)  HAND NUMBNESS (right hand), arm weakness, mild hyperreflexia - check MRI cervical spine (eval for cervical myelopathy)

## 2023-03-09 NOTE — Progress Notes (Signed)
[facility]  PATIENT: [name] DOB: 08-22-1971  REFERRING CLINICIAN: Alyson Ingles, PA* HISTORY FROM: patient  REASON FOR VISIT: new consult   HISTORICAL  CHIEF COMPLAINT:  Chief Complaint  Patient presents with   New Patient (Initial Visit)    Rm 6, here alone  Here for neuropathy, states feels a burning sensation on feet, legs feel heavy. States hands have a burning sensation and feel achy. It started a month ago. States stands for long periods of time at work and notices the symptoms get worse afterwards.     HISTORY OF PRESENT ILLNESS:   52 year old female here for evaluation of bilateral foot pain.  5 years ago patient was diagnosed with diabetes with A1c greater than 15.  At that time she had numbness and pain in her feet and was diagnosed with diabetic neuropathy.  A1c has improved over time.  Unfortunately pain in feet and legs have continued.  In May 2024 she also had increasing low back pain radiating into her bilateral legs and feet.  She tried gabapentin but had side effects and this was changed to duloxetine.   REVIEW OF SYSTEMS: Full 14 system review of systems performed and negative with exception of: as per HPI.  ALLERGIES: No Known Allergies  HOME MEDICATIONS: Outpatient Medications Prior to Visit  Medication Sig Dispense Refill   albuterol (PROVENTIL) (2.5 MG/3ML) 0.083% nebulizer solution Take 3 mLs (2.5 mg total) by nebulization every 6 (six) hours as needed for wheezing or shortness of breath. 75 mL 12   albuterol (VENTOLIN HFA) 108 (90 Base) MCG/ACT inhaler Inhale 1 puff into the lungs every 4 (four) hours as needed.     Ascorbic Acid (VITAMIN C PO) Take by mouth daily.     atorvastatin (LIPITOR) 40 MG tablet Take 40 mg by mouth daily.     Budeson-Glycopyrrol-Formoterol (BREZTRI AEROSPHERE) 160-9-4.8 MCG/ACT AERO Inhale 2 puffs into the lungs in the morning and at bedtime. 10.7 g 11   CALCIUM-VITAMIN D PO Take 1 capsule  by mouth daily.     Continuous Glucose Sensor (DEXCOM G6 SENSOR) MISC Apply topically.     DULoxetine (CYMBALTA) 30 MG capsule Take 30 mg by mouth daily.     gabapentin (NEURONTIN) 300 MG capsule Take by mouth.     hydrochlorothiazide (HYDRODIURIL) 25 MG tablet Take 1 tablet (25 mg total) by mouth daily. 7 tablet 0   JARDIANCE 10 MG TABS tablet Take 10 mg by mouth daily.     levalbuterol (XOPENEX HFA) 45 MCG/ACT inhaler Inhale 1-2 puffs into the lungs every 4 (four) hours as needed for wheezing. 1 each 5   lisinopril (ZESTRIL) 10 MG tablet Take 1 tablet by mouth daily.     metFORMIN (GLUCOPHAGE) 500 MG tablet Take 1 tablet (500 mg total) by mouth daily with breakfast. 7 tablet 0   montelukast (SINGULAIR) 10 MG tablet TAKE 1 TABLET BY MOUTH EVERYDAY AT BEDTIME 90 tablet 1   MOUNJARO 2.5 MG/0.5ML Pen Inject into the skin.     Multiple Vitamin (MULTIVITAMIN) tablet Take 1 tablet by mouth daily.     omega-3 acid ethyl esters (LOVAZA) 1 g capsule Take by mouth 2 (two) times daily.     rosuvastatin (CRESTOR) 10 MG tablet Take 10 mg by mouth at bedtime.     rosuvastatin (CRESTOR) 20 MG tablet Take 20 mg by mouth daily.     OZEMPIC, 0.25 OR 0.5 MG/DOSE, 2 MG/3ML SOPN Inject 0.25 mg as directed every 30 (  thirty) days.     No facility-administered medications prior to visit.    PAST MEDICAL HISTORY: Past Medical History:  Diagnosis Date   Asthma    Diabetes mellitus without complication (HCC)     PAST SURGICAL HISTORY: History reviewed. No pertinent surgical history.  FAMILY HISTORY: Family History  Problem Relation Age of Onset   Breast cancer Neg Hx     SOCIAL HISTORY: Social History   Socioeconomic History   Marital status: Married    Spouse name: Not on file   Number of children: Not on file   Years of education: Not on file   Highest education level: Not on file  Occupational History   Not on file  Tobacco Use   Smoking status: Never   Smokeless tobacco: Never  Substance  and Sexual Activity   Alcohol use: No   Drug use: No   Sexual activity: Not on file  Other Topics Concern   Not on file  Social History Narrative   Not on file   Social Determinants of Health   Financial Resource Strain: Not on file  Food Insecurity: Not on file  Transportation Needs: Not on file  Physical Activity: Not on file  Stress: Not on file  Social Connections: Not on file  Intimate Partner Violence: Not on file     PHYSICAL EXAM  GENERAL EXAM/CONSTITUTIONAL: Vitals:  Vitals:   03/09/23 0916  BP: (!) 146/84  Pulse: 72  Weight: 155 lb (70.3 kg)  Height: 5\' 5"  (1.651 m)   Body mass index is 25.79 kg/m. Wt Readings from Last 3 Encounters:  03/09/23 155 lb (70.3 kg)  08/04/21 174 lb (78.9 kg)  12/20/20 183 lb (83 kg)   Patient is in no distress; well developed, nourished and groomed; neck is supple  CARDIOVASCULAR: Examination of carotid arteries is normal; no carotid bruits Regular rate and rhythm, no murmurs Examination of peripheral vascular system by observation and palpation is normal  EYES: Ophthalmoscopic exam of optic discs and posterior segments is normal; no papilledema or hemorrhages No results found.  MUSCULOSKELETAL: Gait, strength, tone, movements noted in Neurologic exam below  NEUROLOGIC: MENTAL STATUS:      No data to display         awake, alert, oriented to person, place and time recent and remote memory intact normal attention and concentration language fluent, comprehension intact, naming intact fund of knowledge appropriate  CRANIAL NERVE:  2nd - no papilledema on fundoscopic exam 2nd, 3rd, 4th, 6th - pupils equal and reactive to light, visual fields full to confrontation, extraocular muscles intact, no nystagmus 5th - facial sensation symmetric 7th - facial strength symmetric 8th - hearing intact 9th - palate elevates symmetrically, uvula midline 11th - shoulder shrug symmetric 12th - tongue protrusion  midline  MOTOR:  normal bulk and tone, full strength in the BUE EXCEPT DELTOIDS 4/5 LIMITED BY PAIN BLE 4 PROXIMAL, 5 DISTAL  SENSORY:  normal and symmetric to light touch; DECR VIB AND TEMP IN FEET / ANKLES; SLIGHTLY DECR IN HANDS  COORDINATION:  finger-nose-finger, fine finger movements normal  REFLEXES:  deep tendon reflexes --> BUE 2+; KNEES 2; ANKLES TRACE  GAIT/STATION:  narrow based gait; ANTALGIC GAIT     DIAGNOSTIC DATA (LABS, IMAGING, TESTING) - I reviewed patient records, labs, notes, testing and imaging myself where available.  Lab Results  Component Value Date   WBC 7.6 10/21/2015   HGB 14.0 10/21/2015   HCT 43.4 10/21/2015   MCV 86.1 10/21/2015  PLT 374 10/21/2015      Component Value Date/Time   NA 135 10/21/2015 1741   K 3.9 10/21/2015 1741   CL 97 (L) 10/21/2015 1741   CO2 26 10/21/2015 1741   GLUCOSE 300 (H) 10/21/2015 1741   BUN 13 10/21/2015 1741   CREATININE 0.57 10/21/2015 1741   CALCIUM 9.3 10/21/2015 1741   GFRNONAA >60 10/21/2015 1741   GFRAA >60 10/21/2015 1741   No results found for: "CHOL", "HDL", "LDLCALC", "LDLDIRECT", "TRIG", "CHOLHDL" No results found for: "HGBA1C" No results found for: "VITAMINB12" No results found for: "TSH"      ASSESSMENT AND PLAN  52 y.o. year old female here with:   Dx:  1. Spinal stenosis of lumbar region with neurogenic claudication   2. Hyperreflexia   3. Numbness and tingling in right hand   4. Weakness of both lower extremities     PLAN:  BURNING / NUMBNESS IN FEET (diabetic neuropathy since ~2019 with A1c > 15; now A1c 7.3) - continue DM control - continue duloxetine; may increase to 60mg  daily - consider pregabalin 75-150mg  twice a day - consider capsaicin cream, lidocaine patch / cream, alpha-lipoic acid 600mg  daily  LOW BACK PAIN / BILATERAL LEG WEAKNESS / PAIN - check MRI lumbar spine (eval for lumbar spinal stenosis; worsening pain with standing and walking)  HAND NUMBNESS  (right hand), arm weakness, mild hyperreflexia - check MRI cervical spine (eval for cervical myelopathy)  Orders Placed This Encounter  Procedures   MR CERVICAL SPINE W WO CONTRAST   MR LUMBAR SPINE WO CONTRAST   Return for pending if symptoms worsen or fail to improve, pending test results.    Suanne Marker, MD 03/09/2023, 9:59 AM Certified in Neurology, Neurophysiology and Neuroimaging  [facility] [address], [address] [place], [place] [address] [phone]

## 2023-03-10 ENCOUNTER — Telehealth: Payer: Self-pay | Admitting: Diagnostic Neuroimaging

## 2023-03-10 NOTE — Telephone Encounter (Signed)
[employer] TEA sent to [facility] [phone]

## 2023-03-17 DIAGNOSIS — J0140 Acute pansinusitis, unspecified: Secondary | ICD-10-CM | POA: Diagnosis not present

## 2023-03-17 DIAGNOSIS — Z6825 Body mass index (BMI) 25.0-25.9, adult: Secondary | ICD-10-CM | POA: Diagnosis not present

## 2023-03-17 DIAGNOSIS — I10 Essential (primary) hypertension: Secondary | ICD-10-CM | POA: Diagnosis not present

## 2023-03-17 DIAGNOSIS — J209 Acute bronchitis, unspecified: Secondary | ICD-10-CM | POA: Diagnosis not present

## 2023-03-19 DIAGNOSIS — I10 Essential (primary) hypertension: Secondary | ICD-10-CM | POA: Diagnosis not present

## 2023-03-19 DIAGNOSIS — U071 COVID-19: Secondary | ICD-10-CM | POA: Diagnosis not present

## 2023-03-19 DIAGNOSIS — Z20822 Contact with and (suspected) exposure to covid-19: Secondary | ICD-10-CM | POA: Diagnosis not present

## 2023-03-19 DIAGNOSIS — Z6825 Body mass index (BMI) 25.0-25.9, adult: Secondary | ICD-10-CM | POA: Diagnosis not present

## 2023-03-30 DIAGNOSIS — E1142 Type 2 diabetes mellitus with diabetic polyneuropathy: Secondary | ICD-10-CM | POA: Diagnosis not present

## 2023-03-30 DIAGNOSIS — E785 Hyperlipidemia, unspecified: Secondary | ICD-10-CM | POA: Diagnosis not present

## 2023-03-30 DIAGNOSIS — Z0000 Encounter for general adult medical examination without abnormal findings: Secondary | ICD-10-CM | POA: Diagnosis not present

## 2023-03-30 DIAGNOSIS — I10 Essential (primary) hypertension: Secondary | ICD-10-CM | POA: Diagnosis not present

## 2023-03-30 DIAGNOSIS — K219 Gastro-esophageal reflux disease without esophagitis: Secondary | ICD-10-CM | POA: Diagnosis not present

## 2023-05-27 DIAGNOSIS — K219 Gastro-esophageal reflux disease without esophagitis: Secondary | ICD-10-CM | POA: Diagnosis not present

## 2023-05-27 DIAGNOSIS — D649 Anemia, unspecified: Secondary | ICD-10-CM | POA: Diagnosis not present

## 2023-06-23 DIAGNOSIS — Z5309 Procedure and treatment not carried out because of other contraindication: Secondary | ICD-10-CM | POA: Diagnosis not present

## 2023-06-23 DIAGNOSIS — D649 Anemia, unspecified: Secondary | ICD-10-CM | POA: Diagnosis not present

## 2023-06-23 DIAGNOSIS — K219 Gastro-esophageal reflux disease without esophagitis: Secondary | ICD-10-CM | POA: Diagnosis not present

## 2023-06-30 DIAGNOSIS — E1142 Type 2 diabetes mellitus with diabetic polyneuropathy: Secondary | ICD-10-CM | POA: Diagnosis not present

## 2023-06-30 DIAGNOSIS — E1140 Type 2 diabetes mellitus with diabetic neuropathy, unspecified: Secondary | ICD-10-CM | POA: Diagnosis not present

## 2023-06-30 DIAGNOSIS — E785 Hyperlipidemia, unspecified: Secondary | ICD-10-CM | POA: Diagnosis not present

## 2023-06-30 DIAGNOSIS — I10 Essential (primary) hypertension: Secondary | ICD-10-CM | POA: Diagnosis not present

## 2023-07-01 NOTE — Telephone Encounter (Signed)
Dr. Marjory Lies wants these MRIs completed before she can get scheduled for her new referral.

## 2023-07-02 ENCOUNTER — Ambulatory Visit
Admission: RE | Admit: 2023-07-02 | Discharge: 2023-07-02 | Disposition: A | Discharge status: Home / Self Care | Payer: BC Managed Care – PPO | Source: Ambulatory Visit | Attending: Physician Assistant | Admitting: Physician Assistant

## 2023-07-02 DIAGNOSIS — N6315 Unspecified lump in the right breast, overlapping quadrants: Secondary | ICD-10-CM | POA: Diagnosis not present

## 2023-07-02 DIAGNOSIS — N6310 Unspecified lump in the right breast, unspecified quadrant: Secondary | ICD-10-CM

## 2023-07-06 NOTE — Telephone Encounter (Signed)
I resent the MRI orders to [facility] [phone]

## 2023-07-07 ENCOUNTER — Telehealth: Payer: Self-pay | Admitting: Diagnostic Neuroimaging

## 2023-07-07 NOTE — Telephone Encounter (Signed)
[name] from Imaging called stating that they have reached out to the pt several times and they have not been able to speak to her and get her scheduled for the MRI that was ordered for her. [name] was given the other number on file but she stated that she would like our office to reach out to her as well. Please advise.

## 2023-07-12 DIAGNOSIS — J45909 Unspecified asthma, uncomplicated: Secondary | ICD-10-CM | POA: Diagnosis not present

## 2023-07-12 DIAGNOSIS — D649 Anemia, unspecified: Secondary | ICD-10-CM | POA: Diagnosis not present

## 2023-07-12 DIAGNOSIS — K219 Gastro-esophageal reflux disease without esophagitis: Secondary | ICD-10-CM | POA: Diagnosis not present

## 2023-07-21 NOTE — Telephone Encounter (Signed)
Was able to speak with patient and give her the number for Cosonya at [facility]. She is going to give them a call to schedule the MRIs

## 2024-04-12 ENCOUNTER — Ambulatory Visit: Admitting: Primary Care

## 2024-06-04 NOTE — Progress Notes (Signed)
 @Patient  ID: [name], female    DOB: 12-08-1970, 53 y.o.   MRN: [id number]  No chief complaint on file.   Referring provider: Wonda Worth SQUIBB, PA  HPI: 53 year old female, never smoked. PMH significant for severe persistent asthma.   06/05/2024 Discussed the use of AI scribe software for clinical note transcription with the patient, who gave verbal consent to proceed.  History of Present Illness [name] is a 53 year old female with asthma who presents with increased asthma symptoms. Former patient of Dr. Brenna, last seen in November 2022 for severe persistent asthma. Normal spirometry on 08/04/21. Previously on Breztri .   Over the past two months, she has experienced worsening asthma symptoms, including increased shortness of breath, wheezing, chest tightness, and cough. These symptoms occur daily and are more pronounced at night, waking her up every night for the past four weeks.  Despite regular use of her maintenance inhaler, Breo 200mcg, once daily, and her rescue inhaler, albuterol , four to five times a day, she reports difficulty performing tasks at work and home.  She has noticed mucus production from her chest for the past two weeks, which is slightly green in color. No recent fever, sick contacts, or travel. She has not been hospitalized for her breathing in the past six months but recalls a flare-up three months ago, during which she was treated with prednisone .  She has a history of allergies and is currently taking montelukast  (Singulair ). No history of smoking and typically experiences one to two asthma flare-ups per year.  ACT score 6   No Known Allergies  Immunization History  Administered Date(s) Administered   Influenza Split 07/18/2019, 07/05/2020   Influenza,inj,Quad PF,6+ Mos 07/18/2019   PFIZER(Purple Top)SARS-COV-2 Vaccination 07/05/2020   Pfizer(Comirnaty)Fall Seasonal Vaccine 12 years and older 05/23/2024   Tdap 05/05/2019    Past  Medical History:  Diagnosis Date   Asthma    Diabetes mellitus without complication (HCC)     Tobacco History: Social History   Tobacco Use  Smoking Status Never  Smokeless Tobacco Never   Counseling given: Not Answered   Outpatient Medications Prior to Visit  Medication Sig Dispense Refill   albuterol  (PROVENTIL ) (2.5 MG/3ML) 0.083% nebulizer solution Take 3 mLs (2.5 mg total) by nebulization every 6 (six) hours as needed for wheezing or shortness of breath. 75 mL 12   albuterol  (VENTOLIN  HFA) 108 (90 Base) MCG/ACT inhaler Inhale 1 puff into the lungs every 4 (four) hours as needed.     ALPRAZolam  (XANAX ) 0.5 MG tablet for sedation before MRI scan; take 1 tab 1 hour before scan; may repeat 1 tab 15 min before scan 3 tablet 0   Ascorbic Acid (VITAMIN C PO) Take by mouth daily.     atorvastatin (LIPITOR) 40 MG tablet Take 40 mg by mouth daily.     Budeson-Glycopyrrol-Formoterol (BREZTRI  AEROSPHERE) 160-9-4.8 MCG/ACT AERO Inhale 2 puffs into the lungs in the morning and at bedtime. 10.7 g 11   CALCIUM-VITAMIN D PO Take 1 capsule by mouth daily.     Continuous Glucose Sensor (DEXCOM G6 SENSOR) MISC Apply topically.     DULoxetine (CYMBALTA) 30 MG capsule Take 30 mg by mouth daily.     gabapentin (NEURONTIN) 300 MG capsule Take by mouth.     hydrochlorothiazide  (HYDRODIURIL ) 25 MG tablet Take 1 tablet (25 mg total) by mouth daily. 7 tablet 0   JARDIANCE 10 MG TABS tablet Take 10 mg by mouth daily.  levalbuterol  (XOPENEX  HFA) 45 MCG/ACT inhaler Inhale 1-2 puffs into the lungs every 4 (four) hours as needed for wheezing. 1 each 5   lisinopril (ZESTRIL) 10 MG tablet Take 1 tablet by mouth daily.     metFORMIN  (GLUCOPHAGE ) 500 MG tablet Take 1 tablet (500 mg total) by mouth daily with breakfast. 7 tablet 0   montelukast  (SINGULAIR ) 10 MG tablet TAKE 1 TABLET BY MOUTH EVERYDAY AT BEDTIME 90 tablet 1   MOUNJARO 2.5 MG/0.5ML Pen Inject into the skin.     Multiple Vitamin (MULTIVITAMIN)  tablet Take 1 tablet by mouth daily.     omega-3 acid ethyl esters (LOVAZA) 1 g capsule Take by mouth 2 (two) times daily.     rosuvastatin (CRESTOR) 10 MG tablet Take 10 mg by mouth at bedtime.     rosuvastatin (CRESTOR) 20 MG tablet Take 20 mg by mouth daily.     No facility-administered medications prior to visit.   Review of Systems  Review of Systems  Constitutional: Negative.   HENT: Negative.    Respiratory: Negative.     Physical Exam  There were no vitals taken for this visit. Physical Exam Constitutional:      General: She is not in acute distress.    Appearance: Normal appearance. She is not ill-appearing.  HENT:     Head: Normocephalic and atraumatic.  Cardiovascular:     Rate and Rhythm: Normal rate and regular rhythm.  Pulmonary:     Effort: Pulmonary effort is normal.     Breath sounds: Wheezing present.  Musculoskeletal:        General: Normal range of motion.  Skin:    General: Skin is warm and dry.  Neurological:     General: No focal deficit present.     Mental Status: She is alert and oriented to person, place, and time. Mental status is at baseline.  Psychiatric:        Mood and Affect: Mood normal.        Behavior: Behavior normal.        Thought Content: Thought content normal.        Judgment: Judgment normal.      Lab Results:  CBC    Component Value Date/Time   WBC 7.6 10/21/2015 1741   RBC 5.04 10/21/2015 1741   HGB 14.0 10/21/2015 1741   HCT 43.4 10/21/2015 1741   PLT 374 10/21/2015 1741   MCV 86.1 10/21/2015 1741   MCH 27.8 10/21/2015 1741   MCHC 32.3 10/21/2015 1741   RDW 13.2 10/21/2015 1741   LYMPHSABS 1.8 09/08/2008 2109   MONOABS 0.5 09/08/2008 2109   EOSABS 0.2 09/08/2008 2109   BASOSABS 0.0 09/08/2008 2109    BMET    Component Value Date/Time   NA 135 10/21/2015 1741   K 3.9 10/21/2015 1741   CL 97 (L) 10/21/2015 1741   CO2 26 10/21/2015 1741   GLUCOSE 300 (H) 10/21/2015 1741   BUN 13 10/21/2015 1741    CREATININE 0.57 10/21/2015 1741   CALCIUM 9.3 10/21/2015 1741   GFRNONAA >60 10/21/2015 1741   GFRAA >60 10/21/2015 1741    BNP No results found for: BNP  ProBNP No results found for: PROBNP  Imaging: No results found.   Assessment & Plan:   1. Severe persistent asthma, unspecified whether complicated (Primary)  Assessment and Plan Assessment & Plan Severe persistent asthma with acute exacerbation and acute bronchitis Severe persistent asthma with acute exacerbation is indicated by increased use of albuterol   4-5 times daily, shortness of breath, wheezing, chest tightness, and nocturnal cough for two months. Maintained on Breo Ellipta  200mcg daily. ACT score is 6, indicating poor control. Wheezing is present on the right side. Acute bronchitis is suggested by purulent greenish mucus production for two weeks. No recent hospitalizations. No smoking history. Previous response to prednisone  noted. FENO today was 85.  - Administer depo-medrol  80mg  IM today. - Prescribe prednisone  taper 40mg  x 3 days, 30mg  x 3 days, 20mg  x 3 days, 10mg  x 3 days starting tomorrow. - Prescribe azithromycin  (Z-Pak) for bronchitis  - Order lab work to check allergy markers. - Refill Breo 200 mcg inhaler. - Refill albuterol  inhaler. - Advise use of Delsym for cough. - Schedule follow-up appointment in 10-14 days. - Discuss potential future addition of biologics if asthma control does not improve.  Allergic rhinitis Allergic rhinitis managed with montelukast  (Singulair ). - Continue montelukast  as prescribed.   Almarie LELON Ferrari, NP 06/04/2024

## 2024-06-05 ENCOUNTER — Ambulatory Visit: Admitting: Primary Care

## 2024-06-05 ENCOUNTER — Encounter: Payer: Self-pay | Admitting: Primary Care

## 2024-06-05 VITALS — BP 132/74 | HR 83 | Temp 97.5°F | Ht 65.0 in | Wt 181.8 lb

## 2024-06-05 DIAGNOSIS — J209 Acute bronchitis, unspecified: Secondary | ICD-10-CM | POA: Diagnosis not present

## 2024-06-05 DIAGNOSIS — J4550 Severe persistent asthma, uncomplicated: Secondary | ICD-10-CM

## 2024-06-05 DIAGNOSIS — J4551 Severe persistent asthma with (acute) exacerbation: Secondary | ICD-10-CM

## 2024-06-05 DIAGNOSIS — J301 Allergic rhinitis due to pollen: Secondary | ICD-10-CM | POA: Diagnosis not present

## 2024-06-05 LAB — CBC WITH DIFFERENTIAL/PLATELET
Basophils Absolute: 0 K/uL (ref 0.0–0.1)
Basophils Relative: 0.8 % (ref 0.0–3.0)
Eosinophils Absolute: 1 K/uL — ABNORMAL HIGH (ref 0.0–0.7)
Eosinophils Relative: 18.4 % — ABNORMAL HIGH (ref 0.0–5.0)
HCT: 36.3 % (ref 36.0–46.0)
Hemoglobin: 11.9 g/dL — ABNORMAL LOW (ref 12.0–15.0)
Lymphocytes Relative: 29.8 % (ref 12.0–46.0)
Lymphs Abs: 1.7 K/uL (ref 0.7–4.0)
MCHC: 32.9 g/dL (ref 30.0–36.0)
MCV: 84.4 fl (ref 78.0–100.0)
Monocytes Absolute: 0.4 K/uL (ref 0.1–1.0)
Monocytes Relative: 6.5 % (ref 3.0–12.0)
Neutro Abs: 2.5 K/uL (ref 1.4–7.7)
Neutrophils Relative %: 44.5 % (ref 43.0–77.0)
Platelets: 278 K/uL (ref 150.0–400.0)
RBC: 4.3 Mil/uL (ref 3.87–5.11)
RDW: 16 % — ABNORMAL HIGH (ref 11.5–15.5)
WBC: 5.6 K/uL (ref 4.0–10.5)

## 2024-06-05 LAB — POCT EXHALED NITRIC OXIDE: FeNO level (ppb): 85

## 2024-06-05 MED ORDER — PREDNISONE 10 MG PO TABS: ORAL_TABLET | ORAL | 0 refills | Status: AC

## 2024-06-05 MED ORDER — METHYLPREDNISOLONE ACETATE 80 MG/ML IJ SUSP
80.0000 mg | Freq: Once | INTRAMUSCULAR | Status: AC
  Administered 2024-06-05: 80 mg via INTRAMUSCULAR

## 2024-06-05 MED ORDER — AZITHROMYCIN 250 MG PO TABS: ORAL_TABLET | ORAL | 0 refills | Status: AC

## 2024-06-05 MED ORDER — BREO ELLIPTA 200-25 MCG/ACT IN AEPB: 1.0000 | INHALATION_SPRAY | Freq: Every day | RESPIRATORY_TRACT | 3 refills | Status: AC

## 2024-06-05 NOTE — Patient Instructions (Addendum)
   VISIT SUMMARY: Today, you were seen for worsening asthma symptoms that have been affecting you for the past two months. You reported increased shortness of breath, wheezing, chest tightness, and a persistent cough, especially at night. You also mentioned producing greenish mucus for the past two weeks. We discussed your current medications and made some adjustments to help manage your symptoms better.  YOUR PLAN: -SEVERE PERSISTENT ASTHMA WITH ACUTE EXACERBATION AND ACUTE BRONCHITIS: Severe persistent asthma means your asthma symptoms are constant and severe, and an acute exacerbation means they have suddenly worsened. Acute bronchitis is an infection in the airways causing mucus production. We administered a steroid injection today and will start you on a prednisone  taper tomorrow to reduce inflammation. You will also take azithromycin  (Z-Pak) for the bronchitis. Continue using your Breo inhaler daily and your albuterol  inhaler as needed. For your cough, you can use Delsym or Robitussin DM. We will check your allergy markers with lab work and discuss the potential addition of biologics if your asthma does not improve. Please schedule a follow-up appointment in 10-14 days.  -ALLERGIC RHINITIS: Allergic rhinitis is an allergic reaction that causes sneezing, congestion, and a runny nose. Continue taking montelukast  (Singulair ) as prescribed to manage these symptoms.  INSTRUCTIONS: Please schedule a follow-up appointment in 10-14 days. We will review your progress and discuss further treatment options if necessary.  Orders: Labs today FENO  Depo-medrol    RX: Zpack- start today Prednisone  - taper start tomorrow  Follow-up 10-14 days with Landry NP

## 2024-06-08 LAB — IGE: IgE (Immunoglobulin E), Serum: 92 kU/L (ref <=114)

## 2024-06-15 ENCOUNTER — Encounter: Payer: Self-pay | Admitting: Primary Care

## 2024-06-15 ENCOUNTER — Ambulatory Visit: Admitting: Primary Care

## 2024-06-15 DIAGNOSIS — J4550 Severe persistent asthma, uncomplicated: Secondary | ICD-10-CM

## 2024-06-15 NOTE — Progress Notes (Deleted)
 @Patient  ID: [name], female    DOB: 12-Feb-1971, 53 y.o.   MRN: [id number]  No chief complaint on file.   Referring provider: No ref. provider found  HPI: 53 year old female, never smoked. PMH significant for severe persistent asthma.   Previous [facility] pulmonary encounter:  06/05/2024 Discussed the use of AI scribe software for clinical note transcription with the patient, who gave verbal consent to proceed.  History of Present Illness [name] is a 53 year old female with asthma who presents with increased asthma symptoms. Former patient of Dr. Brenna, last seen in November 2022 for severe persistent asthma. Normal spirometry on 08/04/21. Previously on Breztri .   Over the past two months, she has experienced worsening asthma symptoms, including increased shortness of breath, wheezing, chest tightness, and cough. These symptoms occur daily and are more pronounced at night, waking her up every night for the past four weeks.  Despite regular use of her maintenance inhaler, Breo 200mcg, once daily, and her rescue inhaler, albuterol , four to five times a day, she reports difficulty performing tasks at work and home.  She has noticed mucus production from her chest for the past two weeks, which is slightly green in color. No recent fever, sick contacts, or travel. She has not been hospitalized for her breathing in the past six months but recalls a flare-up three months ago, during which she was treated with prednisone .  She has a history of allergies and is currently taking montelukast  (Singulair ). No history of smoking and typically experiences one to two asthma flare-ups per year.  ACT score 6   1. Severe persistent asthma, unspecified whether complicated (Primary)  Assessment and Plan Assessment & Plan Severe persistent asthma with acute exacerbation and acute bronchitis Severe persistent asthma with acute exacerbation is indicated by increased use of albuterol  4-5 times  daily, shortness of breath, wheezing, chest tightness, and nocturnal cough for two months. Maintained on Breo Ellipta  200mcg daily. ACT score is 6, indicating poor control. Wheezing is present on the right side. Acute bronchitis is suggested by purulent greenish mucus production for two weeks. No recent hospitalizations. No smoking history. Previous response to prednisone  noted. FENO today was 85.  - Administer depo-medrol  80mg  IM today. - Prescribe prednisone  taper 40mg  x 3 days, 30mg  x 3 days, 20mg  x 3 days, 10mg  x 3 days starting tomorrow. - Prescribe azithromycin  (Z-Pak) for bronchitis  - Order lab work to check allergy markers. - Refill Breo 200 mcg inhaler. - Refill albuterol  inhaler. - Advise use of Delsym for cough. - Schedule follow-up appointment in 10-14 days. - Discuss potential future addition of biologics if asthma control does not improve.  Allergic rhinitis Allergic rhinitis managed with montelukast  (Singulair ). - Continue montelukast  as prescribed.  06/15/2024- Interim hx  Discussed the use of AI scribe software for clinical note transcription with the patient, who gave verbal consent to proceed.  History of Present Illness   Patient presents today for 10-14 day follow-up asthma exacerbations, treated with zpack, depo-medrol  80mg  IM and prednisone  taper.  Maintained on Breo 200 FENO was 85/ ACT score 6   No Known Allergies  Immunization History  Administered Date(s) Administered   Influenza Split 07/18/2019, 07/05/2020   Influenza,inj,Quad PF,6+ Mos 07/18/2019   PFIZER(Purple Top)SARS-COV-2 Vaccination 07/05/2020   Pfizer(Comirnaty)Fall Seasonal Vaccine 12 years and older 05/23/2024   Tdap 05/05/2019    Past Medical History:  Diagnosis Date   Asthma    Diabetes mellitus without complication (HCC)  Tobacco History: Social History   Tobacco Use  Smoking Status Never  Smokeless Tobacco Never   Counseling given: Not Answered   Outpatient Medications  Prior to Visit  Medication Sig Dispense Refill   albuterol  (PROVENTIL ) (2.5 MG/3ML) 0.083% nebulizer solution Take 3 mLs (2.5 mg total) by nebulization every 6 (six) hours as needed for wheezing or shortness of breath. 75 mL 12   albuterol  (VENTOLIN  HFA) 108 (90 Base) MCG/ACT inhaler Inhale 1 puff into the lungs every 4 (four) hours as needed.     ALPRAZolam  (XANAX ) 0.5 MG tablet for sedation before MRI scan; take 1 tab 1 hour before scan; may repeat 1 tab 15 min before scan 3 tablet 0   Ascorbic Acid (VITAMIN C PO) Take by mouth daily.     atorvastatin (LIPITOR) 40 MG tablet Take 40 mg by mouth daily.     azithromycin  (ZITHROMAX ) 250 MG tablet Per Zpack instructions 6 tablet 0   BREO ELLIPTA  200-25 MCG/ACT AEPB Inhale 1 puff into the lungs daily. 60 each 3   CALCIUM-VITAMIN D PO Take 1 capsule by mouth daily.     Continuous Glucose Sensor (DEXCOM G6 SENSOR) MISC Apply topically.     DULoxetine (CYMBALTA) 30 MG capsule Take 30 mg by mouth daily.     gabapentin (NEURONTIN) 300 MG capsule Take by mouth.     hydrochlorothiazide  (HYDRODIURIL ) 25 MG tablet Take 1 tablet (25 mg total) by mouth daily. 7 tablet 0   JARDIANCE 10 MG TABS tablet Take 10 mg by mouth daily.     levalbuterol  (XOPENEX  HFA) 45 MCG/ACT inhaler Inhale 1-2 puffs into the lungs every 4 (four) hours as needed for wheezing. 1 each 5   lisinopril (ZESTRIL) 10 MG tablet Take 1 tablet by mouth daily.     metFORMIN  (GLUCOPHAGE ) 500 MG tablet Take 1 tablet (500 mg total) by mouth daily with breakfast. 7 tablet 0   montelukast  (SINGULAIR ) 10 MG tablet TAKE 1 TABLET BY MOUTH EVERYDAY AT BEDTIME 90 tablet 1   MOUNJARO 2.5 MG/0.5ML Pen Inject into the skin.     Multiple Vitamin (MULTIVITAMIN) tablet Take 1 tablet by mouth daily.     omega-3 acid ethyl esters (LOVAZA) 1 g capsule Take by mouth 2 (two) times daily.     predniSONE  (DELTASONE ) 10 MG tablet Take 4 tablets (40 mg total) by mouth daily for 3 days, THEN 3 tablets (30 mg total) daily  for 3 days, THEN 2 tablets (20 mg total) daily for 3 days, THEN 1 tablet (10 mg total) daily for 3 days. 30 tablet 0   rosuvastatin (CRESTOR) 10 MG tablet Take 10 mg by mouth at bedtime.     rosuvastatin (CRESTOR) 20 MG tablet Take 20 mg by mouth daily.     No facility-administered medications prior to visit.      Review of Systems  Review of Systems   Physical Exam  There were no vitals taken for this visit. Physical Exam  ***  Lab Results:  CBC    Component Value Date/Time   WBC 5.6 06/05/2024 1100   RBC 4.30 06/05/2024 1100   HGB 11.9 (L) 06/05/2024 1100   HCT 36.3 06/05/2024 1100   PLT 278.0 06/05/2024 1100   MCV 84.4 06/05/2024 1100   MCH 27.8 10/21/2015 1741   MCHC 32.9 06/05/2024 1100   RDW 16.0 (H) 06/05/2024 1100   LYMPHSABS 1.7 06/05/2024 1100   MONOABS 0.4 06/05/2024 1100   EOSABS 1.0 (H) 06/05/2024 1100   BASOSABS  0.0 06/05/2024 1100    BMET    Component Value Date/Time   NA 135 10/21/2015 1741   K 3.9 10/21/2015 1741   CL 97 (L) 10/21/2015 1741   CO2 26 10/21/2015 1741   GLUCOSE 300 (H) 10/21/2015 1741   BUN 13 10/21/2015 1741   CREATININE 0.57 10/21/2015 1741   CALCIUM 9.3 10/21/2015 1741   GFRNONAA >60 10/21/2015 1741   GFRAA >60 10/21/2015 1741    BNP No results found for: BNP  ProBNP No results found for: PROBNP  Imaging: No results found.   Assessment & Plan:   No problem-specific Assessment & Plan notes found for this encounter.   1. Severe persistent asthma, unspecified whether complicated [facility]) (Primary)   Assessment and Plan Assessment & Plan        Almarie LELON Ferrari, NP 06/15/2024

## 2024-07-25 ENCOUNTER — Ambulatory Visit: Payer: Self-pay | Admitting: Primary Care

## 2024-07-25 NOTE — Progress Notes (Signed)
 Please follow-up with patient and see how she is doing since she was seen last month, allergy testing was elevated. I wanted to see her back in 2 weeks

## 2024-07-28 NOTE — Progress Notes (Signed)
 ATC. LMTCB. I will send pt a mychart message then completing note per protocol.
